# Patient Record
Sex: Female | Born: 1940 | Race: White | Hispanic: No | Smoking: Former smoker
Health system: Southern US, Community
[De-identification: ages and names within clinical notes are randomized; demographics above are authoritative.]

## PROBLEM LIST (undated history)

## (undated) DIAGNOSIS — M810 Age-related osteoporosis without current pathological fracture: Secondary | ICD-10-CM

## (undated) DIAGNOSIS — G4733 Obstructive sleep apnea (adult) (pediatric): Secondary | ICD-10-CM

## (undated) DIAGNOSIS — I6523 Occlusion and stenosis of bilateral carotid arteries: Secondary | ICD-10-CM

## (undated) DIAGNOSIS — Z7901 Long term (current) use of anticoagulants: Secondary | ICD-10-CM

## (undated) DIAGNOSIS — F419 Anxiety disorder, unspecified: Secondary | ICD-10-CM

## (undated) DIAGNOSIS — I10 Essential (primary) hypertension: Secondary | ICD-10-CM

## (undated) DIAGNOSIS — E785 Hyperlipidemia, unspecified: Secondary | ICD-10-CM

## (undated) DIAGNOSIS — I6789 Other cerebrovascular disease: Secondary | ICD-10-CM

## (undated) DIAGNOSIS — M1990 Unspecified osteoarthritis, unspecified site: Secondary | ICD-10-CM

## (undated) DIAGNOSIS — I480 Paroxysmal atrial fibrillation: Secondary | ICD-10-CM

## (undated) DIAGNOSIS — Z8669 Personal history of other diseases of the nervous system and sense organs: Secondary | ICD-10-CM

## (undated) DIAGNOSIS — E119 Type 2 diabetes mellitus without complications: Secondary | ICD-10-CM

## (undated) DIAGNOSIS — I639 Cerebral infarction, unspecified: Secondary | ICD-10-CM

## (undated) DIAGNOSIS — I5189 Other ill-defined heart diseases: Secondary | ICD-10-CM

## (undated) DIAGNOSIS — G4730 Sleep apnea, unspecified: Secondary | ICD-10-CM

## (undated) DIAGNOSIS — I493 Ventricular premature depolarization: Secondary | ICD-10-CM

## (undated) DIAGNOSIS — K5792 Diverticulitis of intestine, part unspecified, without perforation or abscess without bleeding: Secondary | ICD-10-CM

## (undated) DIAGNOSIS — K219 Gastro-esophageal reflux disease without esophagitis: Secondary | ICD-10-CM

## (undated) DIAGNOSIS — R011 Cardiac murmur, unspecified: Secondary | ICD-10-CM

## (undated) DIAGNOSIS — I38 Endocarditis, valve unspecified: Secondary | ICD-10-CM

## (undated) DIAGNOSIS — R002 Palpitations: Secondary | ICD-10-CM

## (undated) DIAGNOSIS — G5601 Carpal tunnel syndrome, right upper limb: Secondary | ICD-10-CM

## (undated) DIAGNOSIS — H269 Unspecified cataract: Secondary | ICD-10-CM

## (undated) DIAGNOSIS — T7840XA Allergy, unspecified, initial encounter: Secondary | ICD-10-CM

## (undated) DIAGNOSIS — I491 Atrial premature depolarization: Secondary | ICD-10-CM

## (undated) DIAGNOSIS — T8859XA Other complications of anesthesia, initial encounter: Secondary | ICD-10-CM

## (undated) DIAGNOSIS — D472 Monoclonal gammopathy: Secondary | ICD-10-CM

## (undated) DIAGNOSIS — F32A Depression, unspecified: Secondary | ICD-10-CM

## (undated) DIAGNOSIS — F4323 Adjustment disorder with mixed anxiety and depressed mood: Secondary | ICD-10-CM

## (undated) DIAGNOSIS — R001 Bradycardia, unspecified: Secondary | ICD-10-CM

## (undated) HISTORY — DX: Unspecified cataract: H26.9

## (undated) HISTORY — DX: Essential (primary) hypertension: I10

## (undated) HISTORY — DX: Cerebral infarction, unspecified: I63.9

## (undated) HISTORY — PX: OTHER SURGICAL HISTORY: SHX169

## (undated) HISTORY — PX: ABDOMINAL HYSTERECTOMY: SHX81

## (undated) HISTORY — PX: OOPHORECTOMY: SHX86

## (undated) HISTORY — PX: JOINT REPLACEMENT: SHX530

## (undated) HISTORY — DX: Hyperlipidemia, unspecified: E78.5

## (undated) HISTORY — PX: HIP ARTHROPLASTY: SHX981

## (undated) HISTORY — PX: HERNIA REPAIR: SHX51

## (undated) HISTORY — PX: KNEE ARTHROSCOPY: SUR90

## (undated) HISTORY — DX: Allergy, unspecified, initial encounter: T78.40XA

## (undated) HISTORY — DX: Sleep apnea, unspecified: G47.30

## (undated) HISTORY — PX: CHOLECYSTECTOMY: SHX55

## (undated) HISTORY — PX: APPENDECTOMY: SHX54

## (undated) HISTORY — PX: LAPAROSCOPIC COLON RESECTION: SUR791

## (undated) HISTORY — DX: Unspecified osteoarthritis, unspecified site: M19.90

## (undated) HISTORY — PX: COLONOSCOPY: SHX174

## (undated) HISTORY — DX: Age-related osteoporosis without current pathological fracture: M81.0

## (undated) HISTORY — PX: CATARACT EXTRACTION: SUR2

## (undated) HISTORY — DX: Gastro-esophageal reflux disease without esophagitis: K21.9

---

## 2011-04-22 DIAGNOSIS — Z9842 Cataract extraction status, left eye: Secondary | ICD-10-CM

## 2011-04-22 DIAGNOSIS — Z9841 Cataract extraction status, right eye: Secondary | ICD-10-CM

## 2011-04-22 HISTORY — DX: Cataract extraction status, left eye: Z98.42

## 2011-04-22 HISTORY — DX: Cataract extraction status, left eye: Z98.41

## 2014-07-26 HISTORY — PX: BLEPHAROPLASTY: SUR158

## 2015-12-25 DIAGNOSIS — I10 Essential (primary) hypertension: Secondary | ICD-10-CM | POA: Insufficient documentation

## 2018-08-24 DIAGNOSIS — G4730 Sleep apnea, unspecified: Secondary | ICD-10-CM | POA: Insufficient documentation

## 2019-07-24 ENCOUNTER — Other Ambulatory Visit: Payer: Self-pay | Admitting: Family Medicine

## 2019-07-24 DIAGNOSIS — E2839 Other primary ovarian failure: Secondary | ICD-10-CM

## 2019-11-19 DIAGNOSIS — F4323 Adjustment disorder with mixed anxiety and depressed mood: Secondary | ICD-10-CM | POA: Insufficient documentation

## 2020-03-20 ENCOUNTER — Encounter: Payer: Self-pay | Admitting: Internal Medicine

## 2020-03-20 ENCOUNTER — Other Ambulatory Visit: Payer: Self-pay

## 2020-03-20 ENCOUNTER — Ambulatory Visit (INDEPENDENT_AMBULATORY_CARE_PROVIDER_SITE_OTHER): Payer: Medicare Other | Admitting: Internal Medicine

## 2020-03-20 DIAGNOSIS — Z9989 Dependence on other enabling machines and devices: Secondary | ICD-10-CM | POA: Diagnosis not present

## 2020-03-20 DIAGNOSIS — G4733 Obstructive sleep apnea (adult) (pediatric): Secondary | ICD-10-CM | POA: Diagnosis not present

## 2020-03-20 DIAGNOSIS — I10 Essential (primary) hypertension: Secondary | ICD-10-CM

## 2020-03-20 NOTE — Progress Notes (Addendum)
[facility] [address] [place], [place] [address]  Pulmonary Sleep Medicine   Office [name] Note   Patient Name: Shannon [name] DOB: Feb 09, 1941 MRN [id number]  Date of Service: 03/20/2020  Complaints/HPI: Patient is here to establish care as she recently moved to the [place] area. History of OSA, last PSG in 2013. Over the last few months she has noticed her CPAP has been malfunctioning. She says it has been overheating. She also feels as though she is not getting enough pressure as she has noticed increased daytime sleepiness, increased BP, weight gain and headaches and bilateral ankle swelling. She is concerned about this as she has worn her CPAP every night since she has had it.   ROS  General: (-) fever, (-) chills, (-) night sweats, (-) weakness Skin: (-) rashes, (-) itching,. Eyes: (-) visual changes, (-) redness, (-) itching. Nose and Sinuses: (-) nasal stuffiness or itchiness, (-) postnasal drip, (-) nosebleeds, (-) sinus trouble. Mouth and Throat: (-) sore throat, (-) hoarseness. Neck: (-) swollen glands, (-) enlarged thyroid, (-) neck pain. Respiratory: - cough, (-) bloody sputum, - shortness of breath, - wheezing. Cardiovascular: + ankle swelling, (-) chest pain. Lymphatic: (-) lymph node enlargement. Neurologic: (-) numbness, (-) tingling. Psychiatric: (-) anxiety, (-) depression   Current Medication: Outpatient Encounter Medications as of 03/20/2020  Medication Sig  . Apoaequorin (PREVAGEN PO) Take 1 capsule by mouth every evening.  . calcium citrate-vitamin D (CITRACAL+D) 315-200 MG-UNIT tablet Take by mouth.  . fluticasone (FLONASE) 50 MCG/ACT nasal spray Place into the nose.  . hydrochlorothiazide (HYDRODIURIL) 25 MG tablet Take by mouth.  . hydrocortisone (ANUSOL-HC) 25 MG suppository Place rectally.  Marland Kitchen ketotifen (ZADITOR) 0.025 % ophthalmic solution Apply to eye.  . losartan (COZAAR) 100 MG tablet Take by mouth.  . meloxicam (MOBIC) 15 MG  tablet Take by mouth.  . montelukast (SINGULAIR) 10 MG tablet Take by mouth.  . Multiple Vitamin (MULTI-VITAMIN) tablet Take 1 tablet by mouth daily.  . pantoprazole (PROTONIX) 40 MG tablet Take by mouth.   No facility-administered encounter medications on file as of 03/20/2020.    Surgical History: Past Surgical History:  Procedure Laterality Date  . ABDOMINAL HYSTERECTOMY    . APPENDECTOMY    . CHOLECYSTECTOMY    . JOINT REPLACEMENT     left hip replacement, right knee replacement  . vein ligations      Medical History: Past Medical History:  Diagnosis Date  . Allergy   . Arthritis   . Cataract    surgery both eyes 04/22/2011 implants  . GERD (gastroesophageal reflux disease)   . Hyperlipidemia   . Hypertension   . Osteoporosis   . Sleep apnea     Family History: Family History  Problem Relation Age of Onset  . Cancer Father   . Cancer Sister        liver, lungs  . Arthritis Sister        rheumatoid  . Diabetes Brother   . Cancer Brother        pancreatic, kidney    Social History: Social History   Socioeconomic History  . Marital status: Single    Spouse name: Not on file  . Number of children: Not on file  . Years of education: Not on file  . Highest education level: Not on file  Occupational History  . Not on file  Tobacco Use  . Smoking status: Former Smoker    Types: Cigarettes    Quit date: 08/09/1968  Years since quitting: 51.6  . Smokeless tobacco: Never Used  Substance and Sexual Activity  . Alcohol use: Never  . Drug use: Never  . Sexual activity: Not on file  Other Topics Concern  . Not on file  Social History Narrative  . Not on file   Social Determinants of Health   Financial Resource Strain:   . Difficulty of Paying Living Expenses: Not on file  Food Insecurity:   . Worried About Programme researcher, broadcasting/film/video in the Last Year: Not on file  . Ran Out of Food in the Last Year: Not on file  Transportation Needs:   . Lack of  Transportation (Medical): Not on file  . Lack of Transportation (Non-Medical): Not on file  Physical Activity:   . Days of Exercise per Week: Not on file  . Minutes of Exercise per Session: Not on file  Stress:   . Feeling of Stress : Not on file  Social Connections:   . Frequency of Communication with Friends and Family: Not on file  . Frequency of Social Gatherings with Friends and Family: Not on file  . Attends Religious Services: Not on file  . Active Member of Clubs or Organizations: Not on file  . Attends Banker Meetings: Not on file  . Marital Status: Not on file  Intimate Partner Violence:   . Fear of Current or Ex-Partner: Not on file  . Emotionally Abused: Not on file  . Physically Abused: Not on file  . Sexually Abused: Not on file    Vital Signs: Blood pressure (!) 142/68, pulse 65, temperature (!) 97.3 F (36.3 C), resp. rate 16, height 5\' 4"  (1.626 m), weight 156 lb 9.6 oz (71 kg), SpO2 96 %.  Examination: General Appearance: The patient is well-developed, well-nourished, and in no distress. Skin: Gross inspection of skin unremarkable. Head: normocephalic, no gross deformities. Eyes: no gross deformities noted. ENT: ears appear grossly normal no exudates. Neck: Supple. No thyromegaly. No LAD. Respiratory: Clear throughout. Cardiovascular: Normal S1 and S2 without murmur or rub. Extremities: No cyanosis. pulses are equal. Neurologic: Alert and oriented. No involuntary movements.  LABS: No results found for this or any previous [name] (from the past 2160 hour(s)).  Radiology: Patient was never admitted.   Assessment and Plan: Patient Active Problem List   Diagnosis Date Noted  . Sleep apnea 08/24/2018  . Essential hypertension 12/25/2015   1. OSA on CPAP In need of new CPAP machine as her current machine is malfunctioning. Split study done in 2013, AHI 14.2. Will need to have a CPAP titration study to assess for appropriate pressures in  controlling her OSA. Since malfunctioning, she has reported side effects of untreated OSA. Will work diligently to have testing completed in order to get her a new machine. - Cpap titration; Future  2. Essential hypertension BP elevated today, she has noticed an elevation in her BP since the CPAP started malfunctioning. Advised to inform PCP of elevated BP readings and follow-up as appropriate.   General Counseling: I have discussed the findings of the evaluation and examination with 2014.  I have also discussed any further diagnostic evaluation thatmay be needed or ordered today. Shannon [name]. We also reviewed her medications today and discussed drug interactions and side effects including but not limited excessive drowsiness and altered mental states. We also discussed that there is always a risk not just to her but also people around her. she has been  encouraged to call the office with any questions or concerns that should arise related to todays [name].  Orders Placed This Encounter  Procedures  . Cpap titration    Standing Status:   Future    Standing Expiration Date:   03/20/2021    Scheduling Instructions:     Urgent--Needs new machine    Order Specific Question:   Where should this test be performed:    Answer:   Other     Time spent: 30  I have personally obtained a history, examined the patient, evaluated laboratory and imaging results, formulated the assessment and plan and placed orders. This patient was seen by Brent General AGNP-C in Collaboration with Dr. Freda Munro as a part of collaborative care agreement.    Yevonne Pax, MD [facility] Pulmonary and Critical Care Sleep medicine

## 2020-03-20 NOTE — Patient Instructions (Signed)

## 2020-04-03 ENCOUNTER — Encounter (INDEPENDENT_AMBULATORY_CARE_PROVIDER_SITE_OTHER): Payer: Medicare Other | Admitting: Internal Medicine

## 2020-04-03 DIAGNOSIS — G4733 Obstructive sleep apnea (adult) (pediatric): Secondary | ICD-10-CM | POA: Diagnosis not present

## 2020-04-03 DIAGNOSIS — Z9989 Dependence on other enabling machines and devices: Secondary | ICD-10-CM

## 2020-04-07 NOTE — Procedures (Signed)
[facility]  Polysomnogram Report Part I  Phone: [phone] Fax: [phone]  Patient Name: Shannon [name], Shannon [name] Acquisition Number: [id number]  Date of Birth: 1940-11-20 Acquisition Date: 04/03/2020  Referring Physician: Leeanne Deed, AGNP-C     History: The patient is a 79 year old female with OSA for re-titration of CPAP. Medical History: ??? OSA, hypertension, acid reflux, arthritis ??  Medications: meloxicam, Prevagen, vitamin D, Flonase, hydrochlorothiazide, ketotifen, losartan, montelukast, pantopraxole.  Procedure: This routine overnight polysomnogram was performed on the [facility] 5 using the standard CPAP protocol. This included 6 channels of EEG, 2 channels of EOG, chin EMG, bilateral anterior tibialis EMG, nasal/oral thermistor, PTAF (nasal pressure transducer), chest and abdominal wall movements, EKG, and pulse oximetry.  Description: The total recording time was 407.0 minutes. The total sleep time was 333.5 minutes. There were a total of 46.0 minutes of wakefulness after sleep onset for a?reduced?sleep efficiency of 81.9%. The latency to sleep onset was within normal limits?at 27.5 minutes. The R sleep onset latency was within normal limits at 112.0 minutes.?? Sleep parameters, as a percentage of the total sleep time, demonstrated 15.7% of sleep was in N1 sleep, 54.4% N2, 16.5% N3 and 13.3% R sleep. There were a total of 10 arousals for an arousal index of 1.8 arousals per hour of sleep that was normal.???  The patient did well at all CPAP pressures. There were no respiratory events recorded during the pressure titration. CPAP was initiated at 4 cm H2O at lights out, 10:49 p.m. It was titrated in 1-2 cm increments, largely for flow limitation in REM to the final pressure of 12 cm H2O. The patient was unable to sleep on her back.  Additionally, the baseline oxygen saturation during wakefulness was 95%, during NREM sleep averaged 95%, and during REM sleep averaged 95%. The total  duration of oxygen < 90% was 0.1 minutes.  Cardiac monitoring-  There were no significant cardiac rhythm irregularities.   Periodic limb movement monitoring- demonstrated that there were 9 periodic limb movements for a periodic limb movement index of 1.6 periodic limb movements per hour of sleep.   Impression: This patient's obstructive sleep apnea demonstrated significant improvement with the utilization of nasal CPAP at 12 cm H2O.      Recommendations: 1. CPAP titration study is adequate to control this patients Obstructive Sleep Apnea. The optimal pressure appears to be 12 cm H2O 2. Nasal Decongestants and antihistamines may be of help for increased upper airway resistance. 3. Weight loss through dietary and lifestyle modifications to include exercise is recommended in the presence of obesity. 4. A search for and treatment of underlying cardiopulmonary disease is suggested in the setting of desaturations without significant sleep disordered breathing. 5. Alternative treatment options if the patient is not willing or is unable to use CPAP include oral appliances as well as surgical intervention in the right clinical setting. 6. Clinical correlation is recommended. Please feel free to call the office for any further questions or assistance in the care of this patient. 7. An Air Fit N20  mask, size large, was used. Chin strap -no. Humidifier used during study- yes.     Yevonne Pax, MD, [facility] ABMS Pulmonary, Critical Care and Sleep Medicine  Electronically reviewed and digitally signed

## 2020-04-10 ENCOUNTER — Other Ambulatory Visit: Payer: Self-pay | Admitting: Family Medicine

## 2020-04-10 DIAGNOSIS — Z1231 Encounter for screening mammogram for malignant neoplasm of breast: Secondary | ICD-10-CM

## 2020-04-15 ENCOUNTER — Encounter: Payer: Self-pay | Admitting: Internal Medicine

## 2020-04-15 ENCOUNTER — Telehealth: Payer: Self-pay

## 2020-04-15 DIAGNOSIS — Z91199 Patient's noncompliance with other medical treatment and regimen due to unspecified reason: Secondary | ICD-10-CM

## 2020-04-15 NOTE — Telephone Encounter (Signed)
Confirmed patient appt for 04/17/20 

## 2020-04-15 NOTE — Progress Notes (Signed)
Scanned in ss from fg 

## 2020-04-17 ENCOUNTER — Encounter: Payer: Self-pay | Admitting: Internal Medicine

## 2020-04-17 ENCOUNTER — Other Ambulatory Visit: Payer: Self-pay

## 2020-04-17 ENCOUNTER — Ambulatory Visit (INDEPENDENT_AMBULATORY_CARE_PROVIDER_SITE_OTHER): Payer: Medicare Other | Admitting: Internal Medicine

## 2020-04-17 DIAGNOSIS — G4733 Obstructive sleep apnea (adult) (pediatric): Secondary | ICD-10-CM

## 2020-04-17 DIAGNOSIS — Z9989 Dependence on other enabling machines and devices: Secondary | ICD-10-CM | POA: Diagnosis not present

## 2020-04-17 DIAGNOSIS — I10 Essential (primary) hypertension: Secondary | ICD-10-CM | POA: Diagnosis not present

## 2020-04-17 NOTE — Progress Notes (Signed)
[facility] [address] [place], [place] [address]  Pulmonary Sleep Medicine   Office Visit Note  Patient Name: AVERY EUSTICE DOB: May 27, 1941 MRN [id number]  Date of Service: 04/17/2020  Complaints/HPI: Patient is here for routine pulmonary follow-up She has had CPAP titration study, she is very upset that she has not received a new CPAP yet Will call [facility] to see what the hold up is She has been using a broken CPAP machine for several months She is concerned because since her CPAP has been broken she has noticed an elevation in her BP as well as increased daytime sleepiness  ROS  General: (-) fever, (-) chills, (-) night sweats, (-) weakness Skin: (-) rashes, (-) itching,. Eyes: (-) visual changes, (-) redness, (-) itching. Nose and Sinuses: (-) nasal stuffiness or itchiness, (-) postnasal drip, (-) nosebleeds, (-) sinus trouble. Mouth and Throat: (-) sore throat, (-) hoarseness. Neck: (-) swollen glands, (-) enlarged thyroid, (-) neck pain. Respiratory: - cough, (-) bloody sputum, - shortness of breath, - wheezing. Cardiovascular: - ankle swelling, (-) chest pain. Lymphatic: (-) lymph node enlargement. Neurologic: (-) numbness, (-) tingling. Psychiatric: (-) anxiety, (-) depression   Current Medication: Outpatient Encounter Medications as of 04/17/2020  Medication Sig  . Apoaequorin (PREVAGEN PO) Take 1 capsule by mouth every evening.  . calcium citrate-vitamin D (CITRACAL+D) 315-200 MG-UNIT tablet Take by mouth.  . fluticasone (FLONASE) 50 MCG/ACT nasal spray Place into the nose.  . hydrochlorothiazide (HYDRODIURIL) 25 MG tablet Take by mouth.  . hydrocortisone (ANUSOL-HC) 25 MG suppository Place rectally.  Marland Kitchen ketotifen (ZADITOR) 0.025 % ophthalmic solution Apply to eye.  . losartan (COZAAR) 100 MG tablet Take by mouth.  . meloxicam (MOBIC) 15 MG tablet Take by mouth.  . montelukast (SINGULAIR) 10 MG tablet Take by mouth.  . Multiple Vitamin  (MULTI-VITAMIN) tablet Take 1 tablet by mouth daily.  . pantoprazole (PROTONIX) 40 MG tablet Take by mouth.   No facility-administered encounter medications on file as of 04/17/2020.    Surgical History: Past Surgical History:  Procedure Laterality Date  . ABDOMINAL HYSTERECTOMY    . APPENDECTOMY    . CHOLECYSTECTOMY    . JOINT REPLACEMENT     left hip replacement, right knee replacement  . vein ligations      Medical History: Past Medical History:  Diagnosis Date  . Allergy   . Arthritis   . Cataract    surgery both eyes 04/22/2011 implants  . GERD (gastroesophageal reflux disease)   . Hyperlipidemia   . Hypertension   . Osteoporosis   . Sleep apnea     Family History: Family History  Problem Relation Age of Onset  . Cancer Father   . Cancer Sister        liver, lungs  . Arthritis Sister        rheumatoid  . Diabetes Brother   . Cancer Brother        pancreatic, kidney    Social History: Social History   Socioeconomic History  . Marital status: Single    Spouse name: Not on file  . Number of children: Not on file  . Years of education: Not on file  . Highest education level: Not on file  Occupational History  . Not on file  Tobacco Use  . Smoking status: Former Smoker    Types: Cigarettes    Quit date: 08/09/1968    Years since quitting: 51.7  . Smokeless tobacco: Never Used  Substance and  Sexual Activity  . Alcohol use: Never  . Drug use: Never  . Sexual activity: Not on file  Other Topics Concern  . Not on file  Social History Narrative  . Not on file   Social Determinants of Health   Financial Resource Strain:   . Difficulty of Paying Living Expenses: Not on file  Food Insecurity:   . Worried About Programme researcher, broadcasting/film/video in the Last Year: Not on file  . Ran Out of Food in the Last Year: Not on file  Transportation Needs:   . Lack of Transportation (Medical): Not on file  . Lack of Transportation (Non-Medical): Not on file  Physical  Activity:   . Days of Exercise per Week: Not on file  . Minutes of Exercise per Session: Not on file  Stress:   . Feeling of Stress : Not on file  Social Connections:   . Frequency of Communication with Friends and Family: Not on file  . Frequency of Social Gatherings with Friends and Family: Not on file  . Attends Religious Services: Not on file  . Active Member of Clubs or Organizations: Not on file  . Attends Banker Meetings: Not on file  . Marital Status: Not on file  Intimate Partner Violence:   . Fear of Current or Ex-Partner: Not on file  . Emotionally Abused: Not on file  . Physically Abused: Not on file  . Sexually Abused: Not on file    Vital Signs: Blood pressure (!) 148/82, pulse 70, temperature (!) 97.1 F (36.2 C), resp. rate 16, height 5\' 4"  (1.626 m), weight 156 lb 3.2 oz (70.9 kg), SpO2 97 %.  Examination: General Appearance: The patient is well-developed, well-nourished, and in no distress. Skin: Gross inspection of skin unremarkable. Head: normocephalic, no gross deformities. Eyes: no gross deformities noted. ENT: ears appear grossly normal no exudates. Neck: Supple. No thyromegaly. No LAD. Respiratory: Clear throughout,no rhonchi or wheezing noted. Cardiovascular: Normal S1 and S2 without murmur or rub. Extremities: No cyanosis. pulses are equal. Neurologic: Alert and oriented. No involuntary movements.  LABS: No results found for this or any previous visit (from the past 2160 hour(s)).  Radiology: Patient was never admitted.  No results found.  No results found.    Assessment and Plan: Patient Active Problem List   Diagnosis Date Noted  . Sleep apnea 08/24/2018  . Essential hypertension 12/25/2015    1. OSA on CPAP Have her set up with [facility] to get her new CPAP machine on Tuesday of next week. She is to call office if she does not receive new machine at that time.  2. Essential hypertension Will continue with  monitoring her BP once back on appropriate CPAP, may need to follow-up with PCP.  General Counseling: I have discussed the findings of the evaluation and examination with Wednesday.  I have also discussed any further diagnostic evaluation thatmay be needed or ordered today. Eilis verbalizes understanding of the findings of todays visit. We also reviewed her medications today and discussed drug interactions and side effects including but not limited excessive drowsiness and altered mental states. We also discussed that there is always a risk not just to her but also people around her. she has been encouraged to call the office with any questions or concerns that should arise related to todays visit.     Time spent: 30  I have personally obtained a history, examined the patient, evaluated laboratory and imaging results, formulated the assessment and  plan and placed orders. This patient was seen by Brent General AGNP-C in Collaboration with Dr. Freda Munro as a part of collaborative care agreement.    Yevonne Pax, MD [facility] Pulmonary and Critical Care Sleep medicine

## 2020-04-21 ENCOUNTER — Encounter: Payer: Self-pay | Admitting: Internal Medicine

## 2020-04-21 NOTE — Patient Instructions (Signed)

## 2020-04-28 ENCOUNTER — Other Ambulatory Visit: Payer: Self-pay

## 2020-04-28 ENCOUNTER — Ambulatory Visit
Admission: RE | Admit: 2020-04-28 | Discharge: 2020-04-28 | Disposition: A | Discharge status: Home / Self Care | Payer: Medicare Other | Source: Ambulatory Visit | Attending: Family Medicine | Admitting: Family Medicine

## 2020-04-28 DIAGNOSIS — Z1231 Encounter for screening mammogram for malignant neoplasm of breast: Secondary | ICD-10-CM | POA: Diagnosis not present

## 2020-06-02 ENCOUNTER — Ambulatory Visit (INDEPENDENT_AMBULATORY_CARE_PROVIDER_SITE_OTHER): Payer: Medicare Other | Admitting: Internal Medicine

## 2020-06-02 DIAGNOSIS — Z9989 Dependence on other enabling machines and devices: Secondary | ICD-10-CM

## 2020-06-02 DIAGNOSIS — J302 Other seasonal allergic rhinitis: Secondary | ICD-10-CM | POA: Insufficient documentation

## 2020-06-02 DIAGNOSIS — G4733 Obstructive sleep apnea (adult) (pediatric): Secondary | ICD-10-CM

## 2020-06-02 DIAGNOSIS — Z7189 Other specified counseling: Secondary | ICD-10-CM | POA: Diagnosis not present

## 2020-06-02 NOTE — Progress Notes (Signed)
[facility] [address] [place], [place] [address]  Pulmonary Sleep Medicine   Office Visit Note  Patient Name: Shannon [name] DOB: [birth year] MRN [id number]    Chief Complaint: Obstructive Sleep Apnea visit  Brief History:  Shannon [name] is seen today for initial consultation. She recently received a replacement CPAP. The patient has a 8 year history of sleep apnea. Patient is using PAP nightly.  The patient feels more rested after sleeping with PAP.  The patient reports benefiting from PAP use. She has been waking up due to dry mouth. Reported sleepiness is  improve and the Epworth Sleepiness Score is 3 out of 24. The patient rarely take naps. The patient complains of the following: dry mouth.  The compliance download shows excellent compliance with an average use time of 7.3 hours. The AHI is 0.8  The patient does not complain of limb movements disrupting sleep.  ROS  General: (-) fever, (-) chills, (-) night sweat Nose and Sinuses: (-) nasal stuffiness or itchiness, (-) postnasal drip, (-) nosebleeds, (-) sinus trouble. Mouth and Throat: (-) sore throat, (-) hoarseness. Neck: (-) swollen glands, (-) enlarged thyroid, (-) neck pain. Respiratory: - cough, - shortness of breath, - wheezing. Neurologic: - numbness, - tingling. Psychiatric: - anxiety, - depression   Current Medication: Outpatient Encounter Medications as of 06/02/2020  Medication Sig  . Apoaequorin (PREVAGEN PO) Take 1 capsule by mouth every evening.  . calcium citrate-vitamin D (CITRACAL+D) 315-200 MG-UNIT tablet Take by mouth.  . fluticasone (FLONASE) 50 MCG/ACT nasal spray Place into the nose.  . hydrochlorothiazide (HYDRODIURIL) 25 MG tablet Take by mouth.  . hydrocortisone (ANUSOL-HC) 25 MG suppository Place rectally.  Marland Kitchen ketotifen (ZADITOR) 0.025 % ophthalmic solution Apply to eye.  . losartan (COZAAR) 100 MG tablet Take by mouth.  . meloxicam (MOBIC) 15 MG tablet Take by mouth.  . montelukast  (SINGULAIR) 10 MG tablet Take by mouth.  . Multiple Vitamin (MULTI-VITAMIN) tablet Take 1 tablet by mouth daily.  . pantoprazole (PROTONIX) 40 MG tablet Take by mouth.   No facility-administered encounter medications on file as of 06/02/2020.    Surgical History: Past Surgical History:  Procedure Laterality Date  . ABDOMINAL HYSTERECTOMY    . APPENDECTOMY    . CHOLECYSTECTOMY    . JOINT REPLACEMENT     left hip replacement, right knee replacement  . vein ligations      Medical History: Past Medical History:  Diagnosis Date  . Allergy   . Arthritis   . Cataract    surgery both eyes 04/22/2011 implants  . GERD (gastroesophageal reflux disease)   . Hyperlipidemia   . Hypertension   . Osteoporosis   . Sleep apnea     Family History: Non contributory to the present illness  Social History: Social History   Socioeconomic History  . Marital status: Single    Spouse name: Not on file  . Number of children: Not on file  . Years of education: Not on file  . Highest education level: Not on file  Occupational History  . Not on file  Tobacco Use  . Smoking status: Former Smoker    Types: Cigarettes    Quit date: 08/09/1968    Years since quitting: 51.8  . Smokeless tobacco: Never Used  Substance and Sexual Activity  . Alcohol use: Never  . Drug use: Never  . Sexual activity: Not on file  Other Topics Concern  . Not on file  Social History Narrative  . Not  on file   Social Determinants of Health   Financial Resource Strain:   . Difficulty of Paying Living Expenses: Not on file  Food Insecurity:   . Worried About Programme researcher, broadcasting/film/video in the Last Year: Not on file  . Ran Out of Food in the Last Year: Not on file  Transportation Needs:   . Lack of Transportation (Medical): Not on file  . Lack of Transportation (Non-Medical): Not on file  Physical Activity:   . Days of Exercise per Week: Not on file  . Minutes of Exercise per Session: Not on file  Stress:   .  Feeling of Stress : Not on file  Social Connections:   . Frequency of Communication with Friends and Family: Not on file  . Frequency of Social Gatherings with Friends and Family: Not on file  . Attends Religious Services: Not on file  . Active Member of Clubs or Organizations: Not on file  . Attends Banker Meetings: Not on file  . Marital Status: Not on file  Intimate Partner Violence:   . Fear of Current or Ex-Partner: Not on file  . Emotionally Abused: Not on file  . Physically Abused: Not on file  . Sexually Abused: Not on file    Vital Signs: Blood pressure 134/82, pulse (!) 56, height 5\' 5"  (1.651 m), weight 156 lb (70.8 kg), SpO2 97 %.  Examination: General Appearance: The patient is well-developed, well-nourished, and in no distress. Neck Circumference: 35 Skin: Gross inspection of skin unremarkable. Head: normocephalic, no gross deformities. Eyes: no gross deformities noted. ENT: ears appear grossly normal Neurologic: Alert and oriented. No involuntary movements.    EPWORTH SLEEPINESS SCALE:  Scale:  (0)= no chance of dozing; (1)= slight chance of dozing; (2)= moderate chance of dozing; (3)= high chance of dozing  Chance  Situtation    Sitting and reading: 1    Watching TV: 0    Sitting Inactive in public: 0    As a passenger in car: 0      Lying down to rest: 2    Sitting and talking: 0    Sitting quielty after lunch: 0    In a car, stopped in traffic: 0   TOTAL SCORE:   3 out of 24    SLEEP STUDIES:  1. Split 05/23/12 AHI 14 SpO8min 81%, CPAP 6 cm H2O   CPAP COMPLIANCE DATA:  Date Range: 04/30/20-05/29/20  Average Daily Use: 7.3 hours  Median Use: 7.4  Compliance for > 4 Hours: 100%  AHI: 0.8 respiratory events per hour  Days Used: 30/30  Mask Leak: 38.7  95th Percentile Pressure: 12   LABS: No results found for this or any previous visit (from the past 2160 hour(s)).  Radiology: MM 3D SCREEN BREAST  BILATERAL  Result Date: 05/06/2020 CLINICAL DATA:  Screening. EXAM: DIGITAL SCREENING BILATERAL MAMMOGRAM WITH TOMO AND CAD COMPARISON:  Previous exam(s). ACR Breast Density Category b: There are scattered areas of fibroglandular density. FINDINGS: There are no findings suspicious for malignancy. Images were processed with CAD. IMPRESSION: No mammographic evidence of malignancy. A result letter of this screening mammogram will be mailed directly to the patient. RECOMMENDATION: Screening mammogram in one year. (Code:SM-B-01Y) BI-RADS CATEGORY  1: Negative. Electronically Signed   By: 07/06/2020 M.D.   On: 05/06/2020 15:52    No results found.  No results found.    Assessment and Plan: Patient Active Problem List   Diagnosis Date Noted  . OSA  on CPAP 06/02/2020  . CPAP use counseling 06/02/2020  . Seasonal allergies 06/02/2020  . Essential hypertension 12/25/2015      The patient does tolerate PAP and reports significant benefit from PAP use. The patient was reminded how to adjust the humidifier and fit mask. The patient was also counselled to continue her exercise and diet program.. The compliance is excellent. The apnea is very well controlled.   1. OSA- continue excellent compliance. 2. CPAP couseling-Discussed importance of adequate CPAP use as well as proper care and cleaning techniques of machine and all supplies. 3. Seasonal allergies-Symptoms well controlled at this time, continue with current therapy 4. HTN-BP well controlled today, much improvement since starting back on her CPAP nightly  General Counseling: I have discussed the findings of the evaluation and examination with Shannon [name].  I have also discussed any further diagnostic evaluation thatmay be needed or ordered today. Shannon [name] verbalizes understanding of the findings of todays visit. We also reviewed her medications today and discussed drug interactions and side effects including but not limited excessive drowsiness and  altered mental states. We also discussed that there is always a risk not just to her but also people around her. she has been encouraged to call the office with any questions or concerns that should arise related to todays visit.   I have personally obtained a history, examined the patient, evaluated laboratory and imaging results, formulated the assessment and plan and placed orders.  This patient was seen by Theotis Burrow, AGNP-C in collaboration with Dr. Freda Munro as a part of collaborative care agreement.  Valentino Hue Sol Blazing, PhD, FAASM  Diplomate, American Board of Sleep Medicine    Yevonne Pax, MD [facility] Diplomate ABMS Pulmonary and Critical Care Medicine Sleep medicine

## 2020-06-02 NOTE — Patient Instructions (Signed)

## 2020-09-15 ENCOUNTER — Other Ambulatory Visit
Admission: RE | Admit: 2020-09-15 | Discharge: 2020-09-15 | Disposition: A | Discharge status: Home / Self Care | Payer: Medicare Other | Source: Ambulatory Visit | Attending: Internal Medicine | Admitting: Internal Medicine

## 2020-09-15 ENCOUNTER — Other Ambulatory Visit: Payer: Self-pay

## 2020-09-15 DIAGNOSIS — Z01812 Encounter for preprocedural laboratory examination: Secondary | ICD-10-CM | POA: Diagnosis present

## 2020-09-15 DIAGNOSIS — Z20822 Contact with and (suspected) exposure to covid-19: Secondary | ICD-10-CM | POA: Insufficient documentation

## 2020-09-15 LAB — SARS CORONAVIRUS 2 (TAT 6-24 HRS): SARS Coronavirus 2: NEGATIVE

## 2020-09-16 ENCOUNTER — Encounter: Payer: Self-pay | Admitting: Internal Medicine

## 2020-09-17 ENCOUNTER — Other Ambulatory Visit: Payer: Self-pay

## 2020-09-17 ENCOUNTER — Ambulatory Visit: Payer: Medicare Other | Admitting: Certified Registered Nurse Anesthetist

## 2020-09-17 ENCOUNTER — Encounter
Admission: RE | Disposition: A | Discharge status: Home / Self Care | Payer: Self-pay | Source: Home / Self Care | Attending: Internal Medicine

## 2020-09-17 ENCOUNTER — Ambulatory Visit
Admission: RE | Admit: 2020-09-17 | Discharge: 2020-09-17 | Disposition: A | Discharge status: Home / Self Care | Payer: Medicare Other | Attending: Internal Medicine | Admitting: Internal Medicine

## 2020-09-17 DIAGNOSIS — K640 First degree hemorrhoids: Secondary | ICD-10-CM | POA: Insufficient documentation

## 2020-09-17 DIAGNOSIS — Z980 Intestinal bypass and anastomosis status: Secondary | ICD-10-CM | POA: Insufficient documentation

## 2020-09-17 DIAGNOSIS — K5730 Diverticulosis of large intestine without perforation or abscess without bleeding: Secondary | ICD-10-CM | POA: Diagnosis not present

## 2020-09-17 DIAGNOSIS — Z791 Long term (current) use of non-steroidal anti-inflammatories (NSAID): Secondary | ICD-10-CM | POA: Insufficient documentation

## 2020-09-17 DIAGNOSIS — Z86010 Personal history of colonic polyps: Secondary | ICD-10-CM | POA: Diagnosis not present

## 2020-09-17 DIAGNOSIS — Z79899 Other long term (current) drug therapy: Secondary | ICD-10-CM | POA: Insufficient documentation

## 2020-09-17 DIAGNOSIS — Z881 Allergy status to other antibiotic agents status: Secondary | ICD-10-CM | POA: Insufficient documentation

## 2020-09-17 DIAGNOSIS — Z91041 Radiographic dye allergy status: Secondary | ICD-10-CM | POA: Diagnosis not present

## 2020-09-17 DIAGNOSIS — Z1211 Encounter for screening for malignant neoplasm of colon: Secondary | ICD-10-CM | POA: Insufficient documentation

## 2020-09-17 DIAGNOSIS — Z880 Allergy status to penicillin: Secondary | ICD-10-CM | POA: Diagnosis not present

## 2020-09-17 DIAGNOSIS — Z888 Allergy status to other drugs, medicaments and biological substances status: Secondary | ICD-10-CM | POA: Insufficient documentation

## 2020-09-17 HISTORY — DX: Personal history of other diseases of the nervous system and sense organs: Z86.69

## 2020-09-17 HISTORY — PX: COLONOSCOPY WITH PROPOFOL: SHX5780

## 2020-09-17 HISTORY — DX: Diverticulitis of intestine, part unspecified, without perforation or abscess without bleeding: K57.92

## 2020-09-17 HISTORY — DX: Anxiety disorder, unspecified: F41.9

## 2020-09-17 HISTORY — DX: Depression, unspecified: F32.A

## 2020-09-17 HISTORY — DX: Adjustment disorder with mixed anxiety and depressed mood: F43.23

## 2020-09-17 HISTORY — DX: Cardiac murmur, unspecified: R01.1

## 2020-09-17 SURGERY — COLONOSCOPY WITH PROPOFOL
Anesthesia: General

## 2020-09-17 MED ORDER — PROPOFOL 10 MG/ML IV BOLUS
INTRAVENOUS | Status: DC | PRN
  Administered 2020-09-17: 50 mg via INTRAVENOUS

## 2020-09-17 MED ORDER — PROPOFOL 500 MG/50ML IV EMUL
INTRAVENOUS | Status: DC | PRN
  Administered 2020-09-17: 160 ug/kg/min via INTRAVENOUS

## 2020-09-17 MED ORDER — SODIUM CHLORIDE 0.9 % IV SOLN
INTRAVENOUS | Status: DC
  Administered 2020-09-17: 20 mL/h via INTRAVENOUS

## 2020-09-17 MED ORDER — GLYCOPYRROLATE 0.2 MG/ML IJ SOLN
INTRAMUSCULAR | Status: DC | PRN
  Administered 2020-09-17: .1 mg via INTRAVENOUS

## 2020-09-17 NOTE — Anesthesia Postprocedure Evaluation (Signed)
Anesthesia Post Note  Patient: ADI SEALES  Procedure(s) Performed: COLONOSCOPY WITH PROPOFOL (N/A )  Patient location during evaluation: Phase II Anesthesia Type: General Level of consciousness: awake and alert, awake and oriented Pain management: pain level controlled Vital Signs Assessment: post-procedure vital signs reviewed and stable Respiratory status: spontaneous breathing, nonlabored ventilation and respiratory function stable Cardiovascular status: blood pressure returned to baseline and stable Postop Assessment: no apparent nausea or vomiting Anesthetic complications: no   No complications documented.   Last Vitals:  Vitals:   09/17/20 1058 09/17/20 1145  BP: (!) 112/47 (!) 99/56  Pulse: 84   Resp: 20   Temp: (!) 36.1 C 36.8 C  SpO2: 96%     Last Pain:  Vitals:   09/17/20 1155  TempSrc:   PainSc: 0-No pain                 Manfred Arch

## 2020-09-17 NOTE — Transfer of Care (Signed)
Immediate Anesthesia Transfer of Care Note  Patient: Shannon [name]  Procedure(s) Performed: COLONOSCOPY WITH PROPOFOL (N/A )  Patient Location: PACU  Anesthesia Type:General  Level of Consciousness: awake and alert   Airway & Oxygen Therapy: Patient Spontanous Breathing and Patient connected to nasal cannula oxygen  Post-op Assessment: Report given to RN and Post -op Vital signs reviewed and stable  Post vital signs: Reviewed and stable  Last Vitals:  Vitals Value Taken Time  BP 99/56   Temp    Pulse 71 09/17/20 1147  Resp 21 09/17/20 1147  SpO2 97 % 09/17/20 1147  Vitals shown include unvalidated device data.  Last Pain:  Vitals:   09/17/20 1058  TempSrc: Temporal  PainSc: 0-No pain         Complications: No complications documented.

## 2020-09-17 NOTE — H&P (Signed)
Outpatient short stay form Pre-procedure 09/17/2020 10:56 AM Shannon [name], M.D.  Primary Physician: [name], M.D.  Reason for visit:  Personal history of colon polyps  History of present illness:                            Patient presents for colonoscopy for a personal hx of colon polyps. The patient denies abdominal pain, abnormal weight loss or rectal bleeding.      Current Facility-Administered Medications:  .  0.9 %  sodium chloride infusion, , Intravenous, Continuous, [place], Boykin Nearing, MD  Medications Prior to Admission  Medication Sig Dispense Refill Last Dose  . Apoaequorin (PREVAGEN PO) Take 1 capsule by mouth every evening.   09/16/2020 at Unknown time  . calcium citrate-vitamin D (CITRACAL+D) 315-200 MG-UNIT tablet Take by mouth.   09/16/2020 at Unknown time  . fluticasone (FLONASE) 50 MCG/ACT nasal spray Place into the nose.   09/16/2020 at Unknown time  . gabapentin (NEURONTIN) 100 MG capsule Take 100 mg by mouth 3 (three) times daily.   09/16/2020 at Unknown time  . hydrochlorothiazide (HYDRODIURIL) 25 MG tablet Take by mouth.   09/17/2020 at 0700  . hydrocortisone (ANUSOL-HC) 25 MG suppository Place rectally.   09/16/2020 at Unknown time  . ketotifen (ZADITOR) 0.025 % ophthalmic solution Apply to eye.   09/16/2020 at Unknown time  . losartan (COZAAR) 100 MG tablet Take by mouth.   09/17/2020 at 0700  . meloxicam (MOBIC) 15 MG tablet Take by mouth.   Past Week at Unknown time  . montelukast (SINGULAIR) 10 MG tablet Take by mouth.   09/16/2020 at Unknown time  . Multiple Vitamin (MULTI-VITAMIN) tablet Take 1 tablet by mouth daily.   09/16/2020 at Unknown time  . pantoprazole (PROTONIX) 40 MG tablet Take by mouth.   09/16/2020 at Unknown time  . Prenatal Vit-Fe Fumarate-FA (PRENATAL ONE DAILY PO) Take by mouth.   09/16/2020 at Unknown time     Allergies  Allergen Reactions  . Augmentin [Amoxicillin-Pot Clavulanate]   . Calcium-Containing Compounds   . Ciprofloxacin    . Clams [Shellfish Allergy]   . Contrast Media [Iodinated Diagnostic Agents]   . Flagyl [Metronidazole]   . Statins   . Tape     Adhesive tape-silicones     Past Medical History:  Diagnosis Date  . Allergy   . Anxiety   . Arthritis    osteoarthritis  . Cataract    surgery both eyes 04/22/2011 implants  . Depression   . Diverticulitis   . GERD (gastroesophageal reflux disease)   . Heart murmur   . History of cataract   . Hyperlipidemia   . Hypertension   . Osteoporosis   . Situational mixed anxiety and depressive disorder   . Sleep apnea     Review of systems:  Otherwise negative.    Physical Exam  Gen: Alert, oriented. Appears stated age.  HEENT: [place]/AT. PERRLA. Lungs: CTA, no wheezes. CV: RR nl S1, S2. Abd: soft, benign, no masses. BS+ Ext: No edema. Pulses 2+    Planned procedures: Proceed with colonoscopy. The patient understands the nature of the planned procedure, indications, risks, alternatives and potential complications including but not limited to bleeding, infection, perforation, damage to internal organs and possible oversedation/side effects from anesthesia. The patient agrees and gives consent to proceed.  Please refer to procedure notes for findings, recommendations and patient disposition/instructions.     Shannon [name], M.D. Gastroenterology  09/17/2020  10:56 AM

## 2020-09-17 NOTE — Anesthesia Preprocedure Evaluation (Signed)
Anesthesia Evaluation  Patient identified by MRN, date of birth, ID band Patient awake    Reviewed: Allergy & Precautions, H&P , NPO status , Patient's Chart, lab work & pertinent test results  Airway Mallampati: II  TM Distance: >3 FB Neck ROM: Full    Dental no notable dental hx. (+) Upper Dentures, Lower Dentures   Pulmonary sleep apnea and Continuous Positive Airway Pressure Ventilation , former smoker,    Pulmonary exam normal        Cardiovascular hypertension, Normal cardiovascular exam+ Valvular Problems/Murmurs      Neuro/Psych PSYCHIATRIC DISORDERS Anxiety Depression negative neurological ROS     GI/Hepatic Neg liver ROS, Bowel prep,GERD  Controlled,  Endo/Other  negative endocrine ROS  Renal/GU negative Renal ROS  negative genitourinary   Musculoskeletal  (+) Arthritis , Osteoarthritis,    Abdominal   Peds negative pediatric ROS (+)  Hematology negative hematology ROS (+)   Anesthesia Other Findings Essential hypertension - Primary  Functional diarrhea  Dizziness  Dizziness and giddiness  Mixed hyperlipidemia  Primary osteoarthritis of right knee  COVID-19 virus detected    Reproductive/Obstetrics negative OB ROS                             Anesthesia Physical Anesthesia Plan  ASA: II  Anesthesia Plan: General   Post-op Pain Management:    Induction: Intravenous  PONV Risk Score and Plan: 2 and Propofol infusion and TIVA  Airway Management Planned: Natural Airway and Nasal Cannula  Additional Equipment:   Intra-op Plan:   Post-operative Plan:   Informed Consent: I have reviewed the patients History and Physical, chart, labs and discussed the procedure including the risks, benefits and alternatives for the proposed anesthesia with the patient or authorized representative who has indicated his/her understanding and acceptance.       Plan Discussed  with: CRNA, Anesthesiologist and Surgeon  Anesthesia Plan Comments:         Anesthesia Quick Evaluation

## 2020-09-17 NOTE — Interval H&P Note (Signed)
History and Physical Interval Note:  09/17/2020 10:56 AM  Shannon [name]  has presented today for surgery, with the diagnosis of HX COLON POLYP.  The various methods of treatment have been discussed with the patient and family. After consideration of risks, benefits and other options for treatment, the patient has consented to  Procedure(s): COLONOSCOPY WITH PROPOFOL (N/A) as a surgical intervention.  The patient's history has been reviewed, patient examined, no change in status, stable for surgery.  I have reviewed the patient's chart and labs.  Questions were answered to the patient's satisfaction.     [place], [place]

## 2020-09-17 NOTE — Op Note (Signed)
[facility] Gastroenterology Patient Name: Shannon [name] Procedure Date: 09/17/2020 11:14 AM MRN: [id number] Account #: [id number] Date of Birth: Nov 18, 1940 Admit Type: Outpatient Age: 80 Room: [facility] ENDO ROOM 2 Gender: Female Note Status: Finalized Procedure:             Colonoscopy Indications:           High risk colon cancer surveillance: Personal history                         of colonic polyps Providers:             Boykin Nearing. Norma Fredrickson MD, MD Referring MD:          Myrle Sheng. Katrinka Blazing, MD (Referring MD) Medicines:             Propofol per Anesthesia Complications:         No immediate complications. Procedure:             Pre-Anesthesia Assessment:                        - The risks and benefits of the procedure and the                         sedation options and risks were discussed with the                         patient. All questions were answered and informed                         consent was obtained.                        - The risks and benefits of the procedure and the                         sedation options and risks were discussed with the                         patient. All questions were answered and informed                         consent was obtained.                        - The risks and benefits of the procedure and the                         sedation options and risks were discussed with the                         patient. All questions were answered and informed                         consent was obtained.                        - Patient identification and proposed procedure were                         verified prior  to the procedure by the nurse. The                         procedure was verified in the procedure room.                        - ASA Grade Assessment: III - A patient with severe                         systemic disease.                        - After reviewing the risks and benefits, the patient                          was deemed in satisfactory condition to undergo the                         procedure.                        After obtaining informed consent, the colonoscope was                         passed under direct vision. Throughout the procedure,                         the patient's blood pressure, pulse, and oxygen                         saturations were monitored continuously. The                         Colonoscope was introduced through the anus and                         advanced to the the cecum, identified by appendiceal                         orifice and ileocecal valve. The colonoscopy was                         performed without difficulty. The patient tolerated                         the procedure well. The quality of the bowel                         preparation was adequate. The ileocecal valve,                         appendiceal orifice, and rectum were photographed. Findings:      The perianal and digital rectal examinations were normal. Pertinent       negatives include normal sphincter tone and no palpable rectal lesions.      Non-bleeding internal hemorrhoids were found during retroflexion. The       hemorrhoids were Grade I (internal hemorrhoids that do not prolapse).      There was evidence of a prior functional end-to-end colo-colonic  anastomosis in the recto-sigmoid colon. This was patent and was       characterized by healthy appearing mucosa. The anastomosis was traversed.      Multiple small and large-mouthed diverticula were found in the entire       colon. There was no evidence of diverticular bleeding.      The exam was otherwise without abnormality. Impression:            - Non-bleeding internal hemorrhoids.                        - Patent functional end-to-end colo-colonic                         anastomosis, characterized by healthy appearing mucosa.                        - Moderate diverticulosis in the entire examined                          colon. There was no evidence of diverticular bleeding.                        - The examination was otherwise normal.                        - No specimens collected. Recommendation:        - Patient has a contact number available for                         emergencies. The signs and symptoms of potential                         delayed complications were discussed with the patient.                         Return to normal activities tomorrow. Written                         discharge instructions were provided to the patient.                        - Resume previous diet.                        - Continue present medications.                        - No repeat colonoscopy due to current age 82(66 years                         or older) and the absence of colonic polyps.                        - You do NOT require further colon cancer screening                         measures (Annual stool testing (i.e. hemoccult, FIT,  cologuard), sigmoidoscopy, colonoscopy or CT                         colonography). You should share this recommendation                         with your Primary Care provider.                        - Return to GI office PRN.                        - The findings and recommendations were discussed with                         the patient. Procedure Code(s):     --- Professional ---                        A5409, Colorectal cancer screening; colonoscopy on                         individual at high risk Diagnosis Code(s):     --- Professional ---                        K57.30, Diverticulosis of large intestine without                         perforation or abscess without bleeding                        K64.0, First degree hemorrhoids                        Z98.0, Intestinal bypass and anastomosis status                        Z86.010, Personal history of colonic polyps CPT copyright 2019 American Medical Association. All rights reserved. The codes  documented in this report are preliminary and upon coder review may  be revised to meet current compliance requirements. Stanton Kidney MD, MD 09/17/2020 11:46:25 AM This report has been signed electronically. Number of Addenda: 0 Note Initiated On: 09/17/2020 11:14 AM Scope Withdrawal Time: 0 hours 5 minutes 9 seconds  Total Procedure Duration: 0 hours 8 minutes 7 seconds  Estimated Blood Loss:  Estimated blood loss: none.      [facility]

## 2020-09-18 ENCOUNTER — Encounter: Payer: Self-pay | Admitting: Internal Medicine

## 2020-11-28 ENCOUNTER — Other Ambulatory Visit: Payer: Self-pay | Admitting: Family Medicine

## 2020-11-28 DIAGNOSIS — Z780 Asymptomatic menopausal state: Secondary | ICD-10-CM

## 2021-05-01 ENCOUNTER — Other Ambulatory Visit: Payer: Self-pay | Admitting: Family Medicine

## 2021-05-01 DIAGNOSIS — Z1231 Encounter for screening mammogram for malignant neoplasm of breast: Secondary | ICD-10-CM

## 2021-06-01 ENCOUNTER — Ambulatory Visit (INDEPENDENT_AMBULATORY_CARE_PROVIDER_SITE_OTHER): Payer: Medicare Other | Admitting: Internal Medicine

## 2021-06-01 ENCOUNTER — Other Ambulatory Visit: Payer: Self-pay

## 2021-06-01 VITALS — BP 147/63 | HR 62 | Resp 16 | Ht 64.0 in | Wt 155.0 lb

## 2021-06-01 DIAGNOSIS — Z7189 Other specified counseling: Secondary | ICD-10-CM | POA: Diagnosis not present

## 2021-06-01 DIAGNOSIS — Z9989 Dependence on other enabling machines and devices: Secondary | ICD-10-CM

## 2021-06-01 DIAGNOSIS — G4733 Obstructive sleep apnea (adult) (pediatric): Secondary | ICD-10-CM

## 2021-06-01 DIAGNOSIS — I10 Essential (primary) hypertension: Secondary | ICD-10-CM

## 2021-06-01 NOTE — Patient Instructions (Signed)

## 2021-06-01 NOTE — Progress Notes (Signed)
[facility] [address] [place], [place] [address]  Pulmonary Sleep Medicine   Office Visit Note  Patient Name: Shannon [name] DOB: 1940-10-08 MRN [id number]    Chief Complaint: Obstructive Sleep Apnea visit  Brief History:  Tyerra is seen today for one year follow up The patient has a 9 year history of sleep apnea. Patient is using PAP nightly @ 12 cmH2O.  The patient feels better after sleeping with PAP.  The patient reports benefiting from PAP use. Reported sleepiness is  improved and the Epworth Sleepiness Score is 5 out of 24. The patient does take 3-4 times weekly naps without CPAP 20 min - 1.5 hours. The patient complains of the following: pain level causing more people.  The compliance download shows  compliance with an average use time of 6:57 hours @ 99%. The AHI is 0.7  The patient does not complain of limb movements disrupting sleep.  ROS  General: (-) fever, (-) chills, (-) night sweat Nose and Sinuses: (-) nasal stuffiness or itchiness, (-) postnasal drip, (-) nosebleeds, (-) sinus trouble. Mouth and Throat: (-) sore throat, (-) hoarseness. Neck: (-) swollen glands, (-) enlarged thyroid, (-) neck pain. Respiratory: - cough, - shortness of breath, - wheezing. Neurologic: - numbness, - tingling. Psychiatric: - anxiety, - depression   Current Medication: Outpatient Encounter Medications as of 06/01/2021  Medication Sig   Apoaequorin (PREVAGEN PO) Take 1 capsule by mouth every evening.   calcium citrate-vitamin D (CITRACAL+D) 315-200 MG-UNIT tablet Take by mouth.   fluticasone (FLONASE) 50 MCG/ACT nasal spray Place into the nose.   gabapentin (NEURONTIN) 100 MG capsule Take 100 mg by mouth 3 (three) times daily.   hydrochlorothiazide (HYDRODIURIL) 25 MG tablet Take by mouth.   hydrocortisone (ANUSOL-HC) 25 MG suppository Place rectally.   ketotifen (ZADITOR) 0.025 % ophthalmic solution Apply to eye.   losartan (COZAAR) 100 MG tablet Take by mouth.    meloxicam (MOBIC) 15 MG tablet Take by mouth.   montelukast (SINGULAIR) 10 MG tablet Take by mouth.   Multiple Vitamin (MULTI-VITAMIN) tablet Take 1 tablet by mouth daily.   pantoprazole (PROTONIX) 40 MG tablet Take by mouth.   Prenatal Vit-Fe Fumarate-FA (PRENATAL ONE DAILY PO) Take by mouth.   No facility-administered encounter medications on file as of 06/01/2021.    Surgical History: Past Surgical History:  Procedure Laterality Date   ABDOMINAL HYSTERECTOMY     APPENDECTOMY     BLEPHAROPLASTY  2016   CATARACT EXTRACTION     CHOLECYSTECTOMY     COLONOSCOPY     COLONOSCOPY WITH PROPOFOL N/A 09/17/2020   Procedure: COLONOSCOPY WITH PROPOFOL;  Surgeon: Toledo, Boykin Nearing, MD;  Location: [facility] ENDOSCOPY;  Service: Gastroenterology;  Laterality: N/A;   HERNIA REPAIR     HIP ARTHROPLASTY Bilateral    JOINT REPLACEMENT     left hip replacement, right knee replacement   KNEE ARTHROSCOPY     LAPAROSCOPIC COLON RESECTION     OOPHORECTOMY     vein ligations      Medical History: Past Medical History:  Diagnosis Date   Allergy    Anxiety    Arthritis    osteoarthritis   Cataract    surgery both eyes 04/22/2011 implants   Depression    Diverticulitis    GERD (gastroesophageal reflux disease)    Heart murmur    History of cataract    Hyperlipidemia    Hypertension    Osteoporosis    Situational mixed anxiety and depressive disorder  Sleep apnea     Family History: Non contributory to the present illness  Social History: Social History   Socioeconomic History   Marital status: Divorced    Spouse name: Not on file   Number of children: 3   Years of education: Not on file   Highest education level: Not on file  Occupational History   Not on file  Tobacco Use   Smoking status: Former    Packs/day: 0.25    Years: 15.00    Pack years: 3.75    Types: Cigarettes    Quit date: 08/09/1968    Years since quitting: 52.8   Smokeless tobacco: Never  Substance and Sexual  Activity   Alcohol use: Never   Drug use: Never   Sexual activity: Not Currently  Other Topics Concern   Not on file  Social History Narrative   Not on file   Social Determinants of Health   Financial Resource Strain: Not on file  Food Insecurity: Not on file  Transportation Needs: Not on file  Physical Activity: Not on file  Stress: Not on file  Social Connections: Not on file  Intimate Partner Violence: Not on file    Vital Signs: Blood pressure (!) 147/63, pulse 62, resp. rate 16, height 5\' 4"  (1.626 m), weight 155 lb (70.3 kg), SpO2 96 %.  Examination: General Appearance: The patient is well-developed, well-nourished, and in no distress. Neck Circumference: 35 cm Skin: Gross inspection of skin unremarkable. Head: normocephalic, no gross deformities. Eyes: no gross deformities noted. ENT: ears appear grossly normal Neurologic: Alert and oriented. No involuntary movements.    EPWORTH SLEEPINESS SCALE:  Scale:  (0)= no chance of dozing; (1)= slight chance of dozing; (2)= moderate chance of dozing; (3)= high chance of dozing  Chance  Situtation    Sitting and reading: 0    Watching TV: 2    Sitting Inactive in public: 0    As a passenger in car: 0      Lying down to rest: 1    Sitting and talking: 0    Sitting quielty after lunch: 2    In a car, stopped in traffic: 0   TOTAL SCORE:   5 out of 24    SLEEP STUDIES:  Split 05/23/12 AHI 14 SpO93min 81%   CPAP COMPLIANCE DATA:  Date Range: 05/27/20 - 05/26/21  Average Daily Use: 6:57 hours  Median Use: 6:56 hours  Compliance for > 4 Hours: 99%  AHI: 0.7 respiratory events per hour  Days Used: 365/365  Mask Leak: 42.1 lpm  95th Percentile Pressure: 12 cmH2O    LABS: No results found for this or any previous visit (from the past 2160 hour(s)).  Radiology: No results found.  No results found.  No results found.    Assessment and Plan: Patient Active Problem List   Diagnosis  Date Noted   OSA on CPAP 06/02/2020   CPAP use counseling 06/02/2020   Seasonal allergies 06/02/2020   Essential hypertension 12/25/2015    1. OSA on CPAP The patient does tolerate PAP and reports  benefit from PAP use. The patient was reminded how to clean equipment and advised to replace supplies routinely. The patient was also counselled on weight loss. The compliance is excellent. The AHI is 0.7.   OSA- Continue excellent compliance. Mask fit offered- pt declines.    2. CPAP use counseling CPAP Counseling: had a lengthy discussion with the patient regarding the importance of PAP therapy in management of  the sleep apnea. Patient appears to understand the risk factor reduction and also understands the risks associated with untreated sleep apnea. Patient will try to make a good faith effort to remain compliant with therapy. Also instructed the patient on proper cleaning of the device including the water must be changed daily if possible and use of distilled water is preferred. Patient understands that the machine should be regularly cleaned with appropriate recommended cleaning solutions that do not damage the PAP machine for example given white vinegar and water rinses. Other methods such as ozone treatment may not be as good as these simple methods to achieve cleaning.   3. Essential hypertension Hypertension Counseling:   The following hypertensive lifestyle modification were recommended and discussed:  1. Limiting alcohol intake to less than 1 oz/day of ethanol:(24 oz of beer or 8 oz of wine or 2 oz of 100-proof whiskey). 2. Take baby ASA 81 mg daily. 3. Importance of regular aerobic exercise and losing weight. 4. Reduce dietary saturated fat and cholesterol intake for overall cardiovascular health. 5. Maintaining adequate dietary potassium, calcium, and magnesium intake. 6. Regular monitoring of the blood pressure. 7. Reduce sodium intake to less than 100 mmol/day (less than 2.3 gm of  sodium or less than 6 gm of sodium choride)      General Counseling: I have discussed the findings of the evaluation and examination with Bonita Quin.  I have also discussed any further diagnostic evaluation thatmay be needed or ordered today. Felisia verbalizes understanding of the findings of todays visit. We also reviewed her medications today and discussed drug interactions and side effects including but not limited excessive drowsiness and altered mental states. We also discussed that there is always a risk not just to her but also people around her. she has been encouraged to call the office with any questions or concerns that should arise related to todays visit.  No orders of the defined types were placed in this encounter.       I have personally obtained a history, examined the patient, evaluated laboratory and imaging results, formulated the assessment and plan and placed orders. This patient was seen today by Emmaline Kluver, PA-C in collaboration with Dr. Freda Munro.   Yevonne Pax, MD [facility] Diplomate ABMS Pulmonary Critical Care Medicine and Sleep Medicine

## 2021-07-09 ENCOUNTER — Ambulatory Visit
Admission: RE | Admit: 2021-07-09 | Discharge: 2021-07-09 | Disposition: A | Discharge status: Home / Self Care | Payer: Medicare Other | Source: Ambulatory Visit | Attending: Family Medicine | Admitting: Family Medicine

## 2021-07-09 ENCOUNTER — Other Ambulatory Visit: Payer: Self-pay

## 2021-07-09 DIAGNOSIS — Z1231 Encounter for screening mammogram for malignant neoplasm of breast: Secondary | ICD-10-CM

## 2021-07-09 DIAGNOSIS — Z780 Asymptomatic menopausal state: Secondary | ICD-10-CM | POA: Diagnosis present

## 2021-07-16 ENCOUNTER — Other Ambulatory Visit: Payer: Self-pay | Admitting: Family Medicine

## 2021-07-16 DIAGNOSIS — R928 Other abnormal and inconclusive findings on diagnostic imaging of breast: Secondary | ICD-10-CM

## 2021-07-17 ENCOUNTER — Encounter: Payer: Self-pay | Admitting: Internal Medicine

## 2021-07-24 ENCOUNTER — Ambulatory Visit
Admission: RE | Admit: 2021-07-24 | Discharge: 2021-07-24 | Disposition: A | Discharge status: Home / Self Care | Payer: Medicare Other | Source: Ambulatory Visit | Attending: Family Medicine | Admitting: Family Medicine

## 2021-07-24 ENCOUNTER — Other Ambulatory Visit: Payer: Self-pay

## 2021-07-24 DIAGNOSIS — R928 Other abnormal and inconclusive findings on diagnostic imaging of breast: Secondary | ICD-10-CM | POA: Diagnosis not present

## 2021-07-26 DIAGNOSIS — I639 Cerebral infarction, unspecified: Secondary | ICD-10-CM

## 2021-07-26 HISTORY — DX: Cerebral infarction, unspecified: I63.9

## 2022-01-12 ENCOUNTER — Encounter: Payer: Self-pay | Admitting: Orthopedic Surgery

## 2022-01-12 ENCOUNTER — Other Ambulatory Visit: Payer: Self-pay | Admitting: Orthopedic Surgery

## 2022-01-12 DIAGNOSIS — Z01818 Encounter for other preprocedural examination: Secondary | ICD-10-CM

## 2022-01-12 NOTE — H&P (Signed)
NAME: Shannon [name] MRN:   [id number] DOB:   09-12-1940     HISTORY AND PHYSICAL  CHIEF COMPLAINT:  right hip pain  HISTORY:   BLONDINA CODERRE a 81 y.o. female  with right  Hip Pain Patient complains of right hip pain. Onset of the symptoms was several years ago. Inciting event: known DJD. The patient reports the hip pain is worse with weight bearing. Associated symptoms: none. Aggravating symptoms include: any weight bearing. Patient has had prior hip problems. Previous visits for this problem: multiple, this is a longstanding diagnosis. Last seen several weeks ago by me. Evaluation to date: plain films, which were abnormal  osteoarthritis . Treatment to date: OTC analgesics, which have been somewhat effective, prescription analgesics, which have been somewhat effective, and home exercise program, which has been somewhat effective.      Plan for right total hip replacement  PAST MEDICAL HISTORY:   Past Medical History:  Diagnosis Date   Allergy    Anxiety    Arthritis    osteoarthritis   Cataract    surgery both eyes 04/22/2011 implants   Depression    Diverticulitis    GERD (gastroesophageal reflux disease)    Heart murmur    History of cataract    Hyperlipidemia    Hypertension    Osteoporosis    Situational mixed anxiety and depressive disorder    Sleep apnea     PAST SURGICAL HISTORY:   Past Surgical History:  Procedure Laterality Date   ABDOMINAL HYSTERECTOMY     APPENDECTOMY     BLEPHAROPLASTY  2016   CATARACT EXTRACTION     CHOLECYSTECTOMY     COLONOSCOPY     COLONOSCOPY WITH PROPOFOL N/A 09/17/2020   Procedure: COLONOSCOPY WITH PROPOFOL;  Surgeon: Toledo, Boykin Nearing, MD;  Location: [facility] ENDOSCOPY;  Service: Gastroenterology;  Laterality: N/A;   HERNIA REPAIR     HIP ARTHROPLASTY Bilateral    JOINT REPLACEMENT     left hip replacement, right knee replacement   KNEE ARTHROSCOPY     LAPAROSCOPIC COLON RESECTION     OOPHORECTOMY     vein ligations       MEDICATIONS:  (Not in a hospital admission)   ALLERGIES:   Allergies  Allergen Reactions   Augmentin [Amoxicillin-Pot Clavulanate]    Calcium-Containing Compounds    Ciprofloxacin    Clams [Shellfish Allergy]    Contrast Media [Iodinated Contrast Media]    Flagyl [Metronidazole]    Statins    Tape     Adhesive tape-silicones    REVIEW OF SYSTEMS:   Negative except HPI  FAMILY HISTORY:   Family History  Problem Relation Age of Onset   Cancer Father    Osteoarthritis Father    Ulcers Father    Cancer Sister        liver, lungs   Arthritis Sister        rheumatoid   Hypertension Sister    Hyperlipidemia Sister    Diabetes Sister    GI Disease Sister    Stroke Sister    Liver disease Sister    Arthritis Sister    Diabetes Brother    Osteoarthritis Brother    Skin cancer Brother    Cancer Brother        pancreatic, kidney   Breast cancer Niece        late 83's   Breast cancer Niece     SOCIAL HISTORY:   reports that she quit smoking about 69  years ago. Her smoking use included cigarettes. She has a 3.75 pack-year smoking history. She has never used smokeless tobacco. She reports that she does not drink alcohol and does not use drugs.  PHYSICAL EXAM:  General appearance: alert, cooperative, and no distress Neck: no JVD and supple, symmetrical, trachea midline Resp: clear to auscultation bilaterally Cardio: regular rate and rhythm, S1, S2 normal, no murmur, click, rub or gallop GI: soft, non-tender; bowel sounds normal; no masses,  no organomegaly Extremities: extremities normal, atraumatic, no cyanosis or edema and Homans sign is negative, no sign of DVT Pulses: 2+ and symmetric Skin: Skin color, texture, turgor normal. No rashes or lesions Neurologic: Alert and oriented X 3, normal strength and tone. Normal symmetric reflexes. Normal coordination and gait    LABORATORY STUDIES: No results for input(s): "WBC", "HGB", "HCT", "PLT" in the last 72 hours.  No  results for input(s): "NA", "K", "CL", "CO2", "GLUCOSE", "BUN", "CREATININE", "CALCIUM" in the last 72 hours.  STUDIES/RESULTS:  No results found.  ASSESSMENT:  End stage osteoarthritis right hip        Active Problems:   * No active hospital problems. *    PLAN:  Right Primary Total Hip   Altamese Cabal 01/12/2022. 9:09 AM

## 2022-01-14 ENCOUNTER — Encounter
Admission: RE | Admit: 2022-01-14 | Discharge: 2022-01-14 | Disposition: A | Discharge status: Home / Self Care | Payer: Medicare Other | Source: Ambulatory Visit | Attending: Orthopedic Surgery | Admitting: Orthopedic Surgery

## 2022-01-14 ENCOUNTER — Other Ambulatory Visit: Payer: Self-pay | Admitting: Orthopedic Surgery

## 2022-01-14 HISTORY — DX: Essential (primary) hypertension: I10

## 2022-01-14 NOTE — Patient Instructions (Addendum)
Your procedure is scheduled on: Wednesday, June 28 Report to the Registration Desk on the 1st floor of the [facility] To find out your arrival time, please call [phone] between 1PM - 3PM on: Tuesday, June 27 If your arrival time is 6:00 am, do not arrive prior to that time as the [facility] entrance doors do not open until 6:00 am.  REMEMBER: Instructions that are not followed completely may result in serious medical risk, up to and including death; or upon the discretion of your surgeon and anesthesiologist your surgery may need to be rescheduled.  Do not eat or drink after midnight the night before surgery.  No gum chewing, lozengers or hard candies.  TAKE THESE MEDICATIONS THE MORNING OF SURGERY WITH A SIP OF WATER:  Pantoprazole (Protonix) - (take one the night before and one on the morning of surgery - helps to prevent nausea after surgery.)  One week prior to surgery: starting today, June 22 Stop meloxicam and Anti-inflammatories (NSAIDS) such as Advil, Aleve, Ibuprofen, Motrin, Naproxen, Naprosyn and Aspirin based products such as Excedrin, Goodys Powder, BC Powder. Stop ANY OVER THE COUNTER supplements until after surgery. Stop prevagen, prenatal vitamins You may however, continue to take Tylenol if needed for pain up until the day of surgery.  Continue taking all other prescribed medications up until the day of surgery.  No Alcohol for 24 hours before or after surgery.  No Smoking including e-cigarettes for 24 hours prior to surgery.   On the morning of surgery brush your teeth with toothpaste and water, you may rinse your mouth with mouthwash if you wish. Do not swallow any toothpaste or mouthwash.  Use CHG Soap as directed on instruction sheet.  Do not wear jewelry, make-up, hairpins, clips or nail polish.  Do not wear lotions, powders, or perfumes.   Do not shave body from the neck down 48 hours prior to surgery just in case you cut yourself which could  leave a site for infection.  Also, freshly shaved skin may become irritated if using the CHG soap.  Contact lenses, hearing aids and dentures may not be worn into surgery.  Do not bring valuables to the hospital. [facility] is not responsible for any missing/lost belongings or valuables.   Bring your C-PAP to the hospital with you in case you may have to spend the night.   Notify your doctor if there is any change in your medical condition (cold, fever, infection).  Wear comfortable clothing (specific to your surgery type) to the hospital.  After surgery, you can help prevent lung complications by doing breathing exercises.  Take deep breaths and cough every 1-2 hours. Your doctor may order a device called an Incentive Spirometer to help you take deep breaths.  If you are being admitted to the hospital overnight, leave your suitcase in the car. After surgery it may be brought to your room.  Please call the Pre-admissions Testing Dept. at [phone] if you have any questions about these instructions.  Surgery Visitation Policy:  Patients undergoing a surgery or procedure may have two family members or support persons with them as long as the person is not COVID-19 positive or experiencing its symptoms.   Inpatient Visitation:    Visiting hours are 7 a.m. to 8 p.m. Up to four visitors are allowed at one time in a patient room, including children. The visitors may rotate out with other people during the day. One designated support person (adult) may remain overnight.  Preparing for Surgery with CHLORHEXIDINE GLUCONATE (CHG) Soap    Before surgery, you can play an important role by reducing the number of germs on your skin.  CHG (Chlorhexidine gluconate) soap is an antiseptic cleanser which kills germs and bonds with the skin to continue killing germs even after washing.  Please do not use if you have an allergy to CHG or antibacterial soaps. If your skin becomes  reddened/irritated stop using the CHG.  1. Shower the NIGHT BEFORE SURGERY and the MORNING OF SURGERY with CHG soap.  2. If you choose to wash your hair, wash your hair first as usual with your normal shampoo.  3. After shampooing, rinse your hair and body thoroughly to remove the shampoo.  4. Use CHG as you would any other liquid soap. You can apply CHG directly to the skin and wash gently with a scrungie or a clean washcloth.  5. Apply the CHG soap to your body only from the neck down. Do not use on open wounds or open sores. Avoid contact with your eyes, ears, mouth, and genitals (private parts). Wash face and genitals (private parts) with your normal soap.  6. Wash thoroughly, paying special attention to the area where your surgery will be performed.  7. Thoroughly rinse your body with warm water.  8. Do not shower/wash with your normal soap after using and rinsing off the CHG soap.  9. Pat yourself dry with a clean towel.  10. Wear clean pajamas to bed the night before surgery.  12. Place clean sheets on your bed the night of your first shower and do not sleep with pets.  13. Shower again with the CHG soap on the day of surgery prior to arriving at the hospital.  14. Do not apply any deodorants/lotions/powders.  15. Please wear clean clothes to the hospital.

## 2022-01-14 NOTE — Pre-Procedure Instructions (Signed)
Copy and pasted from Dr. Lidia Collum of [facility] 01/01/2022 note:   81 year old female without significant prior cardiac history She is awaiting right hip replacement scheduled in 1 week Her electrocardiogram demonstrates sinus rhythm nonspecific T changes unchanged since 2019 (and prior to that while living in [place]) She has good functional tolerance greater than 4 METS without signs or symptoms that would suggest ischemia or volume overload Last ischemic evaluation 2017 negative with preserved LV systolic function at that time as well No additional cardiovascular work-up indicated She is at moderate perioperative risk based on her age but otherwise optimized from a cardiovascular standpoint

## 2022-01-15 ENCOUNTER — Encounter
Admission: RE | Admit: 2022-01-15 | Discharge: 2022-01-15 | Disposition: A | Discharge status: Home / Self Care | Payer: Medicare Other | Source: Ambulatory Visit | Attending: Orthopedic Surgery | Admitting: Orthopedic Surgery

## 2022-01-15 DIAGNOSIS — Z01812 Encounter for preprocedural laboratory examination: Secondary | ICD-10-CM | POA: Insufficient documentation

## 2022-01-15 DIAGNOSIS — Z01818 Encounter for other preprocedural examination: Secondary | ICD-10-CM

## 2022-01-15 LAB — BASIC METABOLIC PANEL
Anion gap: 9 (ref 5–15)
BUN: 12 mg/dL (ref 8–23)
CO2: 27 mmol/L (ref 22–32)
Calcium: 9.6 mg/dL (ref 8.9–10.3)
Chloride: 104 mmol/L (ref 98–111)
Creatinine, Ser: 0.69 mg/dL (ref 0.44–1.00)
GFR, Estimated: >60 mL/min (ref >60)
Glucose, Bld: 102 mg/dL — ABNORMAL HIGH (ref 70–99)
Potassium: 3.5 mmol/L (ref 3.5–5.1)
Sodium: 140 mmol/L (ref 135–145)

## 2022-01-15 LAB — URINALYSIS, ROUTINE W REFLEX MICROSCOPIC
Bilirubin Urine: NEGATIVE
Glucose, UA: NEGATIVE mg/dL
Hgb urine dipstick: NEGATIVE
Ketones, ur: 5 mg/dL — AB
Leukocytes,Ua: NEGATIVE
Nitrite: NEGATIVE
Protein, ur: NEGATIVE mg/dL
Specific Gravity, Urine: 1.014 (ref 1.005–1.030)
pH: 5 (ref 5.0–8.0)

## 2022-01-15 LAB — CBC
HCT: 41.7 % (ref 36.0–46.0)
Hemoglobin: 14.3 g/dL (ref 12.0–15.0)
MCH: 32.5 pg (ref 26.0–34.0)
MCHC: 34.3 g/dL (ref 30.0–36.0)
MCV: 94.8 fL (ref 80.0–100.0)
Platelets: 312 10*3/uL (ref 150–400)
RBC: 4.4 MIL/uL (ref 3.87–5.11)
RDW: 12.7 % (ref 11.5–15.5)
WBC: 6.9 10*3/uL (ref 4.0–10.5)
nRBC: 0 % (ref 0.0–0.2)

## 2022-01-15 LAB — TYPE AND SCREEN
ABO/RH(D): A POS
Antibody Screen: NEGATIVE

## 2022-01-15 LAB — SURGICAL PCR SCREEN
MRSA, PCR: NEGATIVE
Staphylococcus aureus: NEGATIVE

## 2022-01-19 NOTE — Anesthesia Preprocedure Evaluation (Addendum)
Anesthesia Evaluation  Patient identified by MRN, date of birth, ID band Patient awake    Reviewed: Allergy & Precautions, H&P , NPO status , Patient's Chart, lab work & pertinent test results  Airway Mallampati: II  TM Distance: >3 FB Neck ROM: Full    Dental  (+) Upper Dentures, Lower Dentures   Pulmonary sleep apnea and Continuous Positive Airway Pressure Ventilation , former smoker,    Pulmonary exam normal        Cardiovascular Exercise Tolerance: Good hypertension, Pt. on medications Normal cardiovascular exam+ Valvular Problems/Murmurs      Neuro/Psych PSYCHIATRIC DISORDERS Anxiety Depression R lateral calf nueropathy    GI/Hepatic Neg liver ROS, hiatal hernia, Bowel prep,GERD  Controlled and Medicated,  Endo/Other  negative endocrine ROS  Renal/GU negative Renal ROS  negative genitourinary   Musculoskeletal  (+) Arthritis , Osteoarthritis,    Abdominal Normal abdominal exam  (+)   Peds negative pediatric ROS (+)  Hematology negative hematology ROS (+)   Anesthesia Other Findings Essential hypertension - Primary  Functional diarrhea  Dizziness  Dizziness and giddiness  Mixed hyperlipidemia  Primary osteoarthritis of right knee  COVID-19 virus detected    Reproductive/Obstetrics negative OB ROS                           Anesthesia Physical  Anesthesia Plan  ASA: II  Anesthesia Plan: Spinal   Post-op Pain Management: Tylenol PO (pre-op)*, Toradol IV (intra-op)* and Regional block*   Induction: Intravenous  PONV Risk Score and Plan: 2 and Propofol infusion, TIVA, Dexamethasone and Ondansetron  Airway Management Planned: Natural Airway and Nasal Cannula  Additional Equipment:   Intra-op Plan:   Post-operative Plan:   Informed Consent: I have reviewed the patients History and Physical, chart, labs and discussed the procedure including the risks, benefits and  alternatives for the proposed anesthesia with the patient or authorized representative who has indicated his/her understanding and acceptance.     Dental advisory given  Plan Discussed with: CRNA, Anesthesiologist and Surgeon  Anesthesia Plan Comments:        Anesthesia Quick Evaluation

## 2022-01-20 ENCOUNTER — Observation Stay
Admission: RE | Admit: 2022-01-20 | Discharge: 2022-01-21 | Disposition: A | Discharge status: Home / Self Care | Payer: Medicare Other | Attending: Orthopedic Surgery | Admitting: Orthopedic Surgery

## 2022-01-20 ENCOUNTER — Ambulatory Visit: Payer: Medicare Other | Admitting: Anesthesiology

## 2022-01-20 ENCOUNTER — Encounter: Payer: Self-pay | Admitting: Orthopedic Surgery

## 2022-01-20 ENCOUNTER — Other Ambulatory Visit: Payer: Self-pay

## 2022-01-20 ENCOUNTER — Ambulatory Visit: Payer: Medicare Other

## 2022-01-20 ENCOUNTER — Encounter
Admission: RE | Disposition: A | Discharge status: Home / Self Care | Payer: Self-pay | Source: Home / Self Care | Attending: Orthopedic Surgery

## 2022-01-20 DIAGNOSIS — M1611 Unilateral primary osteoarthritis, right hip: Principal | ICD-10-CM | POA: Insufficient documentation

## 2022-01-20 DIAGNOSIS — I10 Essential (primary) hypertension: Secondary | ICD-10-CM | POA: Insufficient documentation

## 2022-01-20 DIAGNOSIS — Z96641 Presence of right artificial hip joint: Secondary | ICD-10-CM

## 2022-01-20 DIAGNOSIS — Z01812 Encounter for preprocedural laboratory examination: Secondary | ICD-10-CM

## 2022-01-20 HISTORY — PX: TOTAL HIP ARTHROPLASTY: SHX124

## 2022-01-20 LAB — ABO/RH: ABO/RH(D): A POS

## 2022-01-20 SURGERY — ARTHROPLASTY, HIP, TOTAL, ANTERIOR APPROACH
Anesthesia: Spinal | Site: Hip | Laterality: Right

## 2022-01-20 MED ORDER — MONTELUKAST SODIUM 10 MG PO TABS
10.0000 mg | ORAL_TABLET | Freq: Every day | ORAL | Status: DC
  Administered 2022-01-20: 10 mg via ORAL
  Filled 2022-01-20: qty 1

## 2022-01-20 MED ORDER — FENTANYL CITRATE (PF) 100 MCG/2ML IJ SOLN: 25.0000 ug | INTRAMUSCULAR | Status: DC | PRN

## 2022-01-20 MED ORDER — BISACODYL 10 MG RE SUPP: 10.0000 mg | Freq: Every day | RECTAL | Status: DC | PRN

## 2022-01-20 MED ORDER — MENTHOL 3 MG MT LOZG: 1.0000 | LOZENGE | OROMUCOSAL | Status: DC | PRN

## 2022-01-20 MED ORDER — VALACYCLOVIR HCL 500 MG PO TABS: 1000.0000 mg | ORAL_TABLET | Freq: Two times a day (BID) | ORAL | Status: DC | PRN

## 2022-01-20 MED ORDER — BUPIVACAINE-EPINEPHRINE (PF) 0.25% -1:200000 IJ SOLN
INTRAMUSCULAR | Status: AC
  Filled 2022-01-20: qty 30

## 2022-01-20 MED ORDER — HYDROCODONE-ACETAMINOPHEN 5-325 MG PO TABS
ORAL_TABLET | ORAL | Status: AC
  Filled 2022-01-20: qty 2

## 2022-01-20 MED ORDER — DOCUSATE SODIUM 100 MG PO CAPS
100.0000 mg | ORAL_CAPSULE | Freq: Two times a day (BID) | ORAL | Status: DC
  Administered 2022-01-20 – 2022-01-21 (×3): 100 mg via ORAL
  Filled 2022-01-20 (×3): qty 1

## 2022-01-20 MED ORDER — CEFAZOLIN SODIUM-DEXTROSE 2-4 GM/100ML-% IV SOLN
INTRAVENOUS | Status: AC
  Filled 2022-01-20: qty 100

## 2022-01-20 MED ORDER — PHENOL 1.4 % MT LIQD: 1.0000 | OROMUCOSAL | Status: DC | PRN

## 2022-01-20 MED ORDER — CHLORHEXIDINE GLUCONATE 0.12 % MT SOLN: 15.0000 mL | Freq: Once | OROMUCOSAL | Status: AC

## 2022-01-20 MED ORDER — LOSARTAN POTASSIUM 50 MG PO TABS
100.0000 mg | ORAL_TABLET | Freq: Every day | ORAL | Status: DC
  Administered 2022-01-21: 100 mg via ORAL
  Filled 2022-01-20: qty 2

## 2022-01-20 MED ORDER — METOCLOPRAMIDE HCL 5 MG PO TABS: 5.0000 mg | ORAL_TABLET | Freq: Three times a day (TID) | ORAL | Status: DC | PRN

## 2022-01-20 MED ORDER — PROPOFOL 500 MG/50ML IV EMUL
INTRAVENOUS | Status: DC | PRN
  Administered 2022-01-20: 100 ug/kg/min via INTRAVENOUS

## 2022-01-20 MED ORDER — BUPIVACAINE-EPINEPHRINE (PF) 0.25% -1:200000 IJ SOLN
INTRAMUSCULAR | Status: DC | PRN
  Administered 2022-01-20: 30 mL

## 2022-01-20 MED ORDER — ASPIRIN 81 MG PO CHEW
81.0000 mg | CHEWABLE_TABLET | Freq: Two times a day (BID) | ORAL | Status: DC
  Administered 2022-01-20 – 2022-01-21 (×2): 81 mg via ORAL
  Filled 2022-01-20 (×2): qty 1

## 2022-01-20 MED ORDER — PROMETHAZINE HCL 25 MG/ML IJ SOLN: 6.2500 mg | INTRAMUSCULAR | Status: DC | PRN

## 2022-01-20 MED ORDER — KETOTIFEN FUMARATE 0.025 % OP SOLN: 1.0000 [drp] | OPHTHALMIC | Status: DC | PRN

## 2022-01-20 MED ORDER — PANTOPRAZOLE SODIUM 40 MG PO TBEC
40.0000 mg | DELAYED_RELEASE_TABLET | ORAL | Status: DC
  Administered 2022-01-21: 40 mg via ORAL
  Filled 2022-01-20: qty 1

## 2022-01-20 MED ORDER — OXYCODONE HCL 5 MG PO TABS
ORAL_TABLET | ORAL | Status: AC
  Filled 2022-01-20: qty 1

## 2022-01-20 MED ORDER — METOCLOPRAMIDE HCL 5 MG/ML IJ SOLN: 5.0000 mg | Freq: Three times a day (TID) | INTRAMUSCULAR | Status: DC | PRN

## 2022-01-20 MED ORDER — SODIUM CHLORIDE 0.9 % IV SOLN
INTRAVENOUS | Status: AC | PRN
  Administered 2022-01-20: 250 mL via INTRAMUSCULAR

## 2022-01-20 MED ORDER — FLUTICASONE PROPIONATE 50 MCG/ACT NA SUSP: 1.0000 | Freq: Every evening | NASAL | Status: DC | PRN

## 2022-01-20 MED ORDER — PHENYLEPHRINE HCL-NACL 20-0.9 MG/250ML-% IV SOLN
INTRAVENOUS | Status: DC | PRN
  Administered 2022-01-20: 20 ug/min via INTRAVENOUS

## 2022-01-20 MED ORDER — HYDROCODONE-ACETAMINOPHEN 7.5-325 MG PO TABS
1.0000 | ORAL_TABLET | ORAL | Status: DC | PRN
  Administered 2022-01-21: 2 via ORAL
  Filled 2022-01-20: qty 2

## 2022-01-20 MED ORDER — FENTANYL CITRATE (PF) 100 MCG/2ML IJ SOLN
INTRAMUSCULAR | Status: DC | PRN
  Administered 2022-01-20: 50 ug via INTRAVENOUS
  Administered 2022-01-20 (×2): 25 ug via INTRAVENOUS

## 2022-01-20 MED ORDER — CHLORHEXIDINE GLUCONATE 0.12 % MT SOLN
OROMUCOSAL | Status: AC
  Administered 2022-01-20: 15 mL via OROMUCOSAL
  Filled 2022-01-20: qty 15

## 2022-01-20 MED ORDER — ACETAMINOPHEN 325 MG PO TABS: 325.0000 mg | ORAL_TABLET | Freq: Four times a day (QID) | ORAL | Status: DC | PRN

## 2022-01-20 MED ORDER — ORAL CARE MOUTH RINSE: 15.0000 mL | Freq: Once | OROMUCOSAL | Status: AC

## 2022-01-20 MED ORDER — HYDROCODONE-ACETAMINOPHEN 5-325 MG PO TABS
1.0000 | ORAL_TABLET | ORAL | Status: DC | PRN
  Administered 2022-01-20 (×2): 2 via ORAL
  Administered 2022-01-20: 1 via ORAL
  Filled 2022-01-20 (×2): qty 2

## 2022-01-20 MED ORDER — ONDANSETRON HCL 4 MG PO TABS
4.0000 mg | ORAL_TABLET | Freq: Four times a day (QID) | ORAL | Status: DC | PRN
  Administered 2022-01-20: 4 mg via ORAL
  Filled 2022-01-20: qty 1

## 2022-01-20 MED ORDER — MAGNESIUM HYDROXIDE 400 MG/5ML PO SUSP
30.0000 mL | Freq: Every day | ORAL | Status: DC | PRN
Start: 2022-01-20 — End: 2022-01-21

## 2022-01-20 MED ORDER — VITAMIN B-12 1000 MCG PO TABS
1000.0000 ug | ORAL_TABLET | Freq: Every day | ORAL | Status: DC
  Administered 2022-01-21: 1000 ug via ORAL
  Filled 2022-01-20: qty 1

## 2022-01-20 MED ORDER — LIDOCAINE HCL (CARDIAC) PF 100 MG/5ML IV SOSY
PREFILLED_SYRINGE | INTRAVENOUS | Status: DC | PRN
  Administered 2022-01-20: 60 mg via INTRAVENOUS

## 2022-01-20 MED ORDER — DEXAMETHASONE SODIUM PHOSPHATE 10 MG/ML IJ SOLN
INTRAMUSCULAR | Status: DC | PRN
  Administered 2022-01-20: 5 mg via INTRAVENOUS

## 2022-01-20 MED ORDER — MORPHINE SULFATE (PF) 4 MG/ML IV SOLN
0.5000 mg | INTRAVENOUS | Status: DC | PRN
  Administered 2022-01-21: 1 mg via INTRAVENOUS
  Filled 2022-01-20: qty 1

## 2022-01-20 MED ORDER — ALUM & MAG HYDROXIDE-SIMETH 200-200-20 MG/5ML PO SUSP: 30.0000 mL | ORAL | Status: DC | PRN

## 2022-01-20 MED ORDER — KETAMINE HCL 10 MG/ML IJ SOLN
INTRAMUSCULAR | Status: DC | PRN
  Administered 2022-01-20: 20 mg via INTRAVENOUS

## 2022-01-20 MED ORDER — BUPIVACAINE HCL (PF) 0.5 % IJ SOLN
INTRAMUSCULAR | Status: DC | PRN
  Administered 2022-01-20: 2.5 mL

## 2022-01-20 MED ORDER — KETOROLAC TROMETHAMINE 30 MG/ML IJ SOLN
INTRAMUSCULAR | Status: AC
Start: 2022-01-20 — End: ?
  Filled 2022-01-20: qty 1

## 2022-01-20 MED ORDER — OXYCODONE HCL 5 MG PO TABS
5.0000 mg | ORAL_TABLET | Freq: Once | ORAL | Status: AC | PRN
  Administered 2022-01-20: 5 mg via ORAL

## 2022-01-20 MED ORDER — HYDROCHLOROTHIAZIDE 12.5 MG PO TABS
12.5000 mg | ORAL_TABLET | Freq: Every day | ORAL | Status: DC
  Administered 2022-01-21: 12.5 mg via ORAL
  Filled 2022-01-20: qty 1

## 2022-01-20 MED ORDER — TRANEXAMIC ACID-NACL 1000-0.7 MG/100ML-% IV SOLN
1000.0000 mg | INTRAVENOUS | Status: AC
  Administered 2022-01-20: 1000 mg via INTRAVENOUS

## 2022-01-20 MED ORDER — PROPOFOL 1000 MG/100ML IV EMUL
INTRAVENOUS | Status: AC
  Filled 2022-01-20: qty 100

## 2022-01-20 MED ORDER — GLYCOPYRROLATE 0.2 MG/ML IJ SOLN
INTRAMUSCULAR | Status: DC | PRN
  Administered 2022-01-20: .2 mg via INTRAVENOUS

## 2022-01-20 MED ORDER — ACETAMINOPHEN 500 MG PO TABS: 1000.0000 mg | ORAL_TABLET | Freq: Once | ORAL | Status: AC

## 2022-01-20 MED ORDER — SODIUM CHLORIDE 0.9 % IV SOLN: INTRAVENOUS | Status: DC

## 2022-01-20 MED ORDER — SODIUM CHLORIDE 0.9 % IR SOLN
Status: DC | PRN
  Administered 2022-01-20: 3000 mL

## 2022-01-20 MED ORDER — HYDROCORTISONE ACETATE 25 MG RE SUPP: 25.0000 mg | Freq: Two times a day (BID) | RECTAL | Status: DC | PRN

## 2022-01-20 MED ORDER — ONDANSETRON HCL 4 MG/2ML IJ SOLN
INTRAMUSCULAR | Status: DC | PRN
  Administered 2022-01-20: 4 mg via INTRAVENOUS

## 2022-01-20 MED ORDER — APOAEQUORIN 20 MG PO CAPS: 1.0000 | ORAL_CAPSULE | Freq: Every day | ORAL | Status: DC

## 2022-01-20 MED ORDER — LACTATED RINGERS IV SOLN: INTRAVENOUS | Status: DC

## 2022-01-20 MED ORDER — ACETAMINOPHEN 500 MG PO TABS
ORAL_TABLET | ORAL | Status: AC
  Administered 2022-01-20: 1000 mg via ORAL
  Filled 2022-01-20: qty 2

## 2022-01-20 MED ORDER — CEFAZOLIN SODIUM-DEXTROSE 2-4 GM/100ML-% IV SOLN
2.0000 g | INTRAVENOUS | Status: AC
  Administered 2022-01-20: 2 g via INTRAVENOUS

## 2022-01-20 MED ORDER — ACETAMINOPHEN 10 MG/ML IV SOLN
1000.0000 mg | Freq: Once | INTRAVENOUS | Status: DC | PRN
Start: 2022-01-20 — End: 2022-01-20

## 2022-01-20 MED ORDER — DROPERIDOL 2.5 MG/ML IJ SOLN: 0.6250 mg | Freq: Once | INTRAMUSCULAR | Status: DC | PRN

## 2022-01-20 MED ORDER — EPHEDRINE SULFATE (PRESSORS) 50 MG/ML IJ SOLN
INTRAMUSCULAR | Status: DC | PRN
  Administered 2022-01-20 (×3): 5 mg via INTRAVENOUS

## 2022-01-20 MED ORDER — STERILE WATER FOR IRRIGATION IR SOLN
Status: DC | PRN
  Administered 2022-01-20: 1000 mL

## 2022-01-20 MED ORDER — OXYCODONE HCL 5 MG/5ML PO SOLN: 5.0000 mg | Freq: Once | ORAL | Status: AC | PRN

## 2022-01-20 MED ORDER — KETAMINE HCL 50 MG/5ML IJ SOSY
PREFILLED_SYRINGE | INTRAMUSCULAR | Status: AC
Start: 2022-01-20 — End: ?
  Filled 2022-01-20: qty 5

## 2022-01-20 MED ORDER — CEFAZOLIN SODIUM-DEXTROSE 1-4 GM/50ML-% IV SOLN
1.0000 g | Freq: Four times a day (QID) | INTRAVENOUS | Status: AC
  Administered 2022-01-20 – 2022-01-21 (×2): 1 g via INTRAVENOUS
  Filled 2022-01-20 (×3): qty 50

## 2022-01-20 MED ORDER — POVIDONE-IODINE 10 % EX SWAB
2.0000 | Freq: Once | CUTANEOUS | Status: AC
  Administered 2022-01-20: 2 via TOPICAL

## 2022-01-20 MED ORDER — SURGIPHOR WOUND IRRIGATION SYSTEM - OPTIME
TOPICAL | Status: DC | PRN
  Administered 2022-01-20: 450 mL via TOPICAL

## 2022-01-20 MED ORDER — TRANEXAMIC ACID-NACL 1000-0.7 MG/100ML-% IV SOLN
INTRAVENOUS | Status: AC
  Filled 2022-01-20: qty 100

## 2022-01-20 MED ORDER — 0.9 % SODIUM CHLORIDE (POUR BTL) OPTIME
TOPICAL | Status: DC | PRN
  Administered 2022-01-20: 500 mL

## 2022-01-20 MED ORDER — FENTANYL CITRATE (PF) 100 MCG/2ML IJ SOLN
INTRAMUSCULAR | Status: AC
  Filled 2022-01-20: qty 2

## 2022-01-20 MED ORDER — KETOROLAC TROMETHAMINE 15 MG/ML IJ SOLN
7.5000 mg | Freq: Four times a day (QID) | INTRAMUSCULAR | Status: DC
  Administered 2022-01-20 – 2022-01-21 (×3): 7.5 mg via INTRAVENOUS
  Filled 2022-01-20 (×2): qty 1

## 2022-01-20 MED ORDER — ONDANSETRON HCL 4 MG/2ML IJ SOLN: 4.0000 mg | Freq: Four times a day (QID) | INTRAMUSCULAR | Status: DC | PRN

## 2022-01-20 MED ORDER — GLYCOPYRROLATE 0.2 MG/ML IJ SOLN
INTRAMUSCULAR | Status: AC
  Filled 2022-01-20: qty 1

## 2022-01-20 MED ORDER — KETOROLAC TROMETHAMINE 15 MG/ML IJ SOLN
INTRAMUSCULAR | Status: AC
  Filled 2022-01-20: qty 1

## 2022-01-20 SURGICAL SUPPLY — 57 items
BLADE SAGITTAL WIDE XTHICK NO (BLADE) ×2 IMPLANT
BNDG COHESIVE 4X5 TAN ST LF (GAUZE/BANDAGES/DRESSINGS) ×4 IMPLANT
BRUSH SCRUB EZ  4% CHG (MISCELLANEOUS) ×1
BRUSH SCRUB EZ 4% CHG (MISCELLANEOUS) ×1 IMPLANT
CHLORAPREP W/TINT 26 (MISCELLANEOUS) ×2 IMPLANT
COVER HOLE (Hips) ×1 IMPLANT
CUP R3 52MM (Hips) ×1 IMPLANT
DRAPE 3/4 80X56 (DRAPES) ×2 IMPLANT
DRAPE C-ARM 42X72 X-RAY (DRAPES) ×2 IMPLANT
DRAPE STERI IOBAN 125X83 (DRAPES) IMPLANT
DRAPE U-SHAPE 47X51 STRL (DRAPES) ×2 IMPLANT
DRSG AQUACEL AG ADV 3.5X10 (GAUZE/BANDAGES/DRESSINGS) ×1 IMPLANT
DRSG AQUACEL AG ADV 3.5X14 (GAUZE/BANDAGES/DRESSINGS) IMPLANT
ELECT REM PT RETURN 9FT ADLT (ELECTROSURGICAL) ×2
ELECTRODE REM PT RTRN 9FT ADLT (ELECTROSURGICAL) ×1 IMPLANT
GAUZE 4X4 16PLY ~~LOC~~+RFID DBL (SPONGE) ×2 IMPLANT
GAUZE XEROFORM 1X8 LF (GAUZE/BANDAGES/DRESSINGS) IMPLANT
GLOVE BIO SURGEON STRL SZ8 (GLOVE) ×2 IMPLANT
GLOVE BIOGEL PI IND STRL 8.5 (GLOVE) ×2 IMPLANT
GLOVE BIOGEL PI INDICATOR 8.5 (GLOVE) ×2
GLOVE SURG ORTHO 8.5 STRL (GLOVE) ×2 IMPLANT
GOWN STRL REUS W/ TWL XL LVL3 (GOWN DISPOSABLE) ×2 IMPLANT
GOWN STRL REUS W/TWL XL LVL3 (GOWN DISPOSABLE) ×2
HEAD FEM KNEE TAPER 36MM XS-3 (Head) ×1 IMPLANT
HOLSTER ELECTROSUGICAL PENCIL (MISCELLANEOUS) ×2 IMPLANT
HOOD PEEL AWAY FLYTE STAYCOOL (MISCELLANEOUS) ×6 IMPLANT
IV NS 250ML (IV SOLUTION)
IV NS 250ML BAXH (IV SOLUTION) IMPLANT
IV NS IRRIG 3000ML ARTHROMATIC (IV SOLUTION) ×2 IMPLANT
KIT PATIENT CARE HANA TABLE (KITS) ×2 IMPLANT
KIT TURNOVER CYSTO (KITS) ×2 IMPLANT
LINER ACETAB 0 DEG (Liner) ×1 IMPLANT
MANIFOLD NEPTUNE II (INSTRUMENTS) ×2 IMPLANT
MAT ABSORB  FLUID 56X50 GRAY (MISCELLANEOUS) ×1
MAT ABSORB FLUID 56X50 GRAY (MISCELLANEOUS) ×1 IMPLANT
NDL SAFETY ECLIPSE 18X1.5 (NEEDLE) IMPLANT
NDL SPNL 20GX3.5 QUINCKE YW (NEEDLE) ×1 IMPLANT
NEEDLE HYPO 18GX1.5 SHARP (NEEDLE)
NEEDLE HYPO 22GX1.5 SAFETY (NEEDLE) ×2 IMPLANT
NEEDLE SPNL 20GX3.5 QUINCKE YW (NEEDLE) ×2 IMPLANT
PACK HIP PROSTHESIS (MISCELLANEOUS) ×2 IMPLANT
PADDING CAST BLEND 4X4 NS (MISCELLANEOUS) ×4 IMPLANT
PILLOW ABDUCTION MEDIUM (MISCELLANEOUS) ×2 IMPLANT
PULSAVAC PLUS IRRIG FAN TIP (DISPOSABLE) ×2
SCREW 6.5X25MM (Screw) ×1 IMPLANT
SOLUTION IRRIG SURGIPHOR (IV SOLUTION) ×2 IMPLANT
SPONGE T-LAP 18X18 ~~LOC~~+RFID (SPONGE) ×8 IMPLANT
STAPLER SKIN PROX 35W (STAPLE) ×2 IMPLANT
STEM LATERAL COLLAR LAT1 12/14 (Stem) ×1 IMPLANT
SUT BONE WAX W31G (SUTURE) ×2 IMPLANT
SUT DVC 2 QUILL PDO  T11 36X36 (SUTURE) ×1
SUT DVC 2 QUILL PDO T11 36X36 (SUTURE) ×1 IMPLANT
SUT VIC AB 2-0 CT1 18 (SUTURE) ×2 IMPLANT
SYR 20ML LL LF (SYRINGE) ×2 IMPLANT
TIP FAN IRRIG PULSAVAC PLUS (DISPOSABLE) ×1 IMPLANT
WAND WEREWOLF FASTSEAL 6.0 (MISCELLANEOUS) ×2 IMPLANT
WATER STERILE IRR 500ML POUR (IV SOLUTION) ×2 IMPLANT

## 2022-01-20 NOTE — Transfer of Care (Signed)
Immediate Anesthesia Transfer of Care Note  Patient: Shannon [name]  Procedure(s) Performed: TOTAL HIP ARTHROPLASTY ANTERIOR APPROACH (Right: Hip)  Patient Location: PACU  Anesthesia Type:Spinal  Level of Consciousness: awake, alert  and oriented  Airway & Oxygen Therapy: Patient Spontanous Breathing  Post-op Assessment: Report given to RN and Post -op Vital signs reviewed and stable  Post vital signs: Reviewed and stable  Last Vitals:  Vitals Value Taken Time  BP 111/44 01/20/22 1155  Temp 36.4 C 01/20/22 1155  Pulse 74 01/20/22 1155  Resp 20 01/20/22 1155  SpO2 96 % 01/20/22 1155  Vitals shown include unvalidated device data.  Last Pain:  Vitals:   01/20/22 0839  TempSrc: Oral  PainSc: 2          Complications: No notable events documented.

## 2022-01-20 NOTE — Anesthesia Procedure Notes (Signed)
Spinal  Patient location during procedure: OR Reason for block: surgical anesthesia Staffing Performed by: Demetrius Charity, CRNA Authorized by: Iran Ouch, MD   Preanesthetic Checklist Completed: patient identified, IV checked, site marked, risks and benefits discussed, surgical consent, monitors and equipment checked, pre-op evaluation and timeout performed Spinal Block Patient position: sitting Prep: Betadine Patient monitoring: heart rate, continuous pulse ox, blood pressure and cardiac monitor Approach: midline Location: L4-5 Injection technique: single-shot Needle Needle type: Whitacre and Introducer  Needle gauge: 24 G Needle length: 9 cm Assessment Events: CSF return Additional Notes Negative paresthesia. Negative blood return. Positive free-flowing CSF. Expiration date of kit checked and confirmed. Patient tolerated procedure well, without complications.

## 2022-01-20 NOTE — Op Note (Signed)
01/20/2022  11:42 AM  PATIENT:  Shannon [name]   MRN: [id number]  PRE-OPERATIVE DIAGNOSIS:  Osteoarthritis right  hip   POST-OPERATIVE DIAGNOSIS: Same  Procedure: Right Total Hip Replacement  Surgeon: Dola Argyle. Odis Luster, MD   Assist: Altamese Cabal, PA-C  Anesthesia: Spinal   EBL: 100 mL   Specimens: None   Drains: None   Components used: A size 1 lateral Polarstem Smith and Nephew, R3 size 52 mm shell, and a 36 mm -3 mm head    Description of the procedure in detail: After informed consent was obtained and the appropriate extremity marked in the pre-operative holding area, the patient was taken to the operating room and placed in the supine position on the fracture table. All pressure points were well padded and bilateral lower extremities were place in traction spars. The hip was prepped and draped in standard sterile fashion. A spinal anesthetic had been delivered by the anesthesia team. The skin and subcutaneous tissues were injected with a mixture of Marcaine with epinephrine for post-operative pain. A longitudinal incision approximately 10 cm in length was carried out from the anterior superior iliac spine to the greater trochanter. The tensor fascia was divided and blunt dissection was taken down to the level of the joint capsule. The lateral circumflex vessels were cauterized. Deep retractors were placed and a portion of the anterior capsule was excised. Using fluoroscopy the neck cut was planned and carried out with a sagittal saw. The head was passed from the field with use of a corkscrew and hip skid. Deep retractors were placed along the acetabulum and the degenerative labrum and large osteophytes were removed with a Rongeur. The cup was sequentially reamed to a size 52 mm. The wound was irrigated and using fluoroscopy the size 52 mm cup was impacted in to anatomic position. A single screw was placed followed by a threaded hole cover. The final liner was impacted in to position.  Attention was then turned to the proximal femur. The leg was placed in extension and external rotation. The canal was opened and sequentially broached to a size 1 lateral. The trial components were placed and the hip relocated. The components were found to be in good position using fluoroscopy. The hip was dislocated and the trial components removed. The final components were impacted in to position and the hip relocated. The final components were again check with fluoroscopy and found to be in good position. Hemostasis was achieved with electrocautery. The deep capsule was injected with Marcaine and epinephrine. The wound was irrigated with bacitracin laced normal saline and the tensor fascia closed with #2 Quill suture. The subcutaneous tissues were closed with 2-0 vicryl and staples for the skin. A sterile dressing was applied and an abduction pillow. Patient tolerated the procedure well and there were no apparent complication. Patient was taken to the recovery room in good condition.   Cassell Smiles, MD

## 2022-01-20 NOTE — Anesthesia Procedure Notes (Signed)
Spinal  Patient location during procedure: OR Start time: 01/20/2022 10:10 AM End time: 01/20/2022 10:15 AM Reason for block: surgical anesthesia Staffing Performed: resident/CRNA  Resident/CRNA: Demetrius Charity, CRNA Performed by: Demetrius Charity, CRNA Authorized by: Iran Ouch, MD   Preanesthetic Checklist Completed: patient identified, IV checked, site marked, risks and benefits discussed, surgical consent, monitors and equipment checked, pre-op evaluation and timeout performed Spinal Block Patient position: sitting Prep: ChloraPrep Patient monitoring: heart rate, continuous pulse ox, blood pressure and cardiac monitor Approach: midline Location: L3-4 Injection technique: single-shot Needle Needle type: Whitacre and Introducer  Needle gauge: 25 G Needle length: 9 cm Assessment Sensory level: T10 Events: CSF return Additional Notes Negative paresthesia. Negative blood return. Positive free-flowing CSF. Expiration date of kit checked and confirmed. Patient tolerated procedure well, without complications.

## 2022-01-20 NOTE — Evaluation (Signed)
Physical Therapy Evaluation Patient Details Name: ARHIANNA EBEY MRN: [id number] DOB: 05-29-1941 Today's Date: 01/20/2022  History of Present Illness  81 y/o female s/p R THA. PMH includes L THA (posterior approach 2020), R TKA (2021), anxiety, depression, cataracts and sleep apnea.   Clinical Impression  Pt in bed, agreeable to therapy. Daughter in room is very supportive. Pt lives alone in [place]; her daughter will be staying with her upon d/c. No steps to navigate. Pt is independent at baseline and is active. She performed all mobility with CGA, primarily for safety. She was able to the progress to SUP near end of session. Pt ambulated to the restroom and back to the recliner (RN aware of void). Once seated for ~10 minutes, pt was ready to return to bed due to discomfort in hip. PT assisted with transfer back to bed. She performed seated therex from THA packet. Will need to review supine exercises. Recommending HHPT at d/c. Would benefit from skilled PT to address above deficits and promote optimal return to PLOF.      Recommendations for follow up therapy are one component of a multi-disciplinary discharge planning process, led by the attending physician.  Recommendations may be updated based on patient status, additional functional criteria and insurance authorization.  Follow Up Recommendations Home health PT      Assistance Recommended at Discharge Intermittent Supervision/Assistance  Patient can return home with the following  A little help with walking and/or transfers;A little help with bathing/dressing/bathroom;Assistance with cooking/housework;Assist for transportation    Equipment Recommendations None recommended by PT  Recommendations for Other Services       Functional Status Assessment Patient has had a recent decline in their functional status and demonstrates the ability to make significant improvements in function in a reasonable and predictable amount of  time.     Precautions / Restrictions Precautions Precautions: None;Anterior Hip Restrictions Weight Bearing Restrictions: Yes RLE Weight Bearing: Weight bearing as tolerated      Mobility  Bed Mobility Overal bed mobility: Needs Assistance Bed Mobility: Supine to Sit, Sit to Supine     Supine to sit: Min guard, HOB elevated Sit to supine: Min guard   General bed mobility comments: Pt able to manage RLE using UE support    Transfers Overall transfer level: Needs assistance Equipment used: Rolling walker (2 wheels) Transfers: Sit to/from Stand, Bed to chair/wheelchair/BSC Sit to Stand: Min guard Stand pivot transfers: Min guard         General transfer comment: CGA for safety, progressing to SUP. VC to remind hand placement    Ambulation/Gait Ambulation/Gait assistance: Min guard Gait Distance (Feet): 15 Feet Assistive device: Rolling walker (2 wheels) Gait Pattern/deviations: Step-to pattern, Decreased stance time - right Gait velocity: decreased     General Gait Details: 47ft x2 reps. CGA for safety, no steadying required.  Stairs            Wheelchair Mobility    Modified Rankin (Stroke Patients Only)       Balance Overall balance assessment: Needs assistance Sitting-balance support: Feet supported Sitting balance-Leahy Scale: Good     Standing balance support: Bilateral upper extremity supported Standing balance-Leahy Scale: Fair Standing balance comment: using RW                             Pertinent Vitals/Pain Pain Assessment Pain Assessment: Faces Faces Pain Scale: Hurts little more Pain Location: right hip Pain Descriptors / Indicators:  Aching Pain Intervention(s): Limited activity within patient's tolerance, Monitored during session, Repositioned    Home Living Family/patient expects to be discharged to:: Private residence Living Arrangements: Alone Available Help at Discharge: Family Type of Home: Apartment Home  Access: Level entry       Home Layout: One level Home Equipment: Agricultural consultant (2 wheels);BSC/3in1;Shower seat      Prior Function Prior Level of Function : Independent/Modified Independent;Driving             Mobility Comments: Does not use AD at baseline; very active with gardening, walking her dog, etc. Denies falls. ADLs Comments: Independent     Hand Dominance        Extremity/Trunk Assessment   Upper Extremity Assessment Upper Extremity Assessment: Overall WFL for tasks assessed (pain in bilateral wrists (baseline))    Lower Extremity Assessment Lower Extremity Assessment: RLE deficits/detail RLE Deficits / Details: RLE s/p THA; LLE WFL. Mild sensation changes in left foot.       Communication   Communication: No difficulties  Cognition Arousal/Alertness: Awake/alert Behavior During Therapy: WFL for tasks assessed/performed Overall Cognitive Status: Within Functional Limits for tasks assessed                                          General Comments      Exercises Total Joint Exercises Ankle Circles/Pumps: AROM, Right, 10 reps, Seated Long Arc Quad: AROM, Right, 10 reps, Seated   Assessment/Plan    PT Assessment Patient needs continued PT services  PT Problem List Decreased strength;Decreased range of motion;Decreased activity tolerance;Decreased balance;Decreased mobility       PT Treatment Interventions Balance training;Gait training;Neuromuscular re-education;Functional mobility training;Patient/family education;Therapeutic activities;Therapeutic exercise    PT Goals (Current goals can be found in the Care Plan section)  Acute Rehab PT Goals Patient Stated Goal: to go home PT Goal Formulation: With patient Time For Goal Achievement: 02/03/22 Potential to Achieve Goals: Good    Frequency BID     Co-evaluation               AM-PAC PT "6 Clicks" Mobility  Outcome Measure Help needed turning from your back to your  side while in a flat bed without using bedrails?: None Help needed moving from lying on your back to sitting on the side of a flat bed without using bedrails?: None Help needed moving to and from a bed to a chair (including a wheelchair)?: A Little Help needed standing up from a chair using your arms (e.g., wheelchair or bedside chair)?: A Little Help needed to walk in hospital room?: A Little Help needed climbing 3-5 steps with a railing? : A Little 6 Click Score: 20    End of Session Equipment Utilized During Treatment: Gait belt Activity Tolerance: Patient tolerated treatment well Patient left: in bed;with family/visitor present Nurse Communication: Mobility status PT Visit Diagnosis: Muscle weakness (generalized) (M62.81)    Time: 1455-1540 PT Time Calculation (min) (ACUTE ONLY): 45 min   Charges:   PT Evaluation $PT Eval Low Complexity: 1 Low PT Treatments $Therapeutic Activity: 8-22 mins        Basilia Jumbo PT, DPT

## 2022-01-20 NOTE — H&P (Signed)
The patient has been re-examined, and the chart reviewed, and there have been no interval changes to the documented history and physical.  Plan a right total hip today.  Anesthesia is not consulted regarding a peripheral nerve block for post-operative pain.  The risks, benefits, and alternatives have been discussed at length, and the patient is willing to proceed.    

## 2022-01-21 DIAGNOSIS — M1611 Unilateral primary osteoarthritis, right hip: Secondary | ICD-10-CM | POA: Diagnosis not present

## 2022-01-21 MED ORDER — HYDROCODONE-ACETAMINOPHEN 5-325 MG PO TABS: 1.0000 | ORAL_TABLET | ORAL | 0 refills | Status: DC | PRN

## 2022-01-21 MED ORDER — DOCUSATE SODIUM 100 MG PO CAPS: 100.0000 mg | ORAL_CAPSULE | Freq: Two times a day (BID) | ORAL | 0 refills | Status: DC

## 2022-01-21 MED ORDER — METHOCARBAMOL 750 MG PO TABS: 750.0000 mg | ORAL_TABLET | Freq: Three times a day (TID) | ORAL | 1 refills | Status: DC | PRN

## 2022-01-21 MED ORDER — ASPIRIN 81 MG PO CHEW: 81.0000 mg | CHEWABLE_TABLET | Freq: Two times a day (BID) | ORAL | 0 refills | Status: DC

## 2022-01-21 NOTE — Progress Notes (Signed)
  The patient lives at Home alone, in [place]; her daughter will be staying with her upon d/c The patient  currently has DME including a rolling walker and 3 in 1 The patient will need no additional DME They have transportation with her daughter They can afford their medication  They are set up with [facility]  for Home health services

## 2022-01-21 NOTE — Plan of Care (Signed)

## 2022-01-21 NOTE — Discharge Instructions (Signed)

## 2022-01-21 NOTE — Anesthesia Postprocedure Evaluation (Signed)
Anesthesia Post Note  Patient: ANESSIA OAKLAND  Procedure(s) Performed: TOTAL HIP ARTHROPLASTY ANTERIOR APPROACH (Right: Hip)  Patient location during evaluation: Nursing Unit Anesthesia Type: Spinal Level of consciousness: oriented and awake and alert Pain management: pain level controlled Vital Signs Assessment: post-procedure vital signs reviewed and stable Respiratory status: spontaneous breathing and respiratory function stable Cardiovascular status: blood pressure returned to baseline and stable Postop Assessment: no headache, no backache, no apparent nausea or vomiting, patient able to bend at knees, spinal receding, adequate PO intake and able to ambulate Anesthetic complications: no   No notable events documented.   Last Vitals:  Vitals:   01/20/22 2013 01/21/22 0512  BP: (!) 142/57 (!) 131/47  Pulse: (!) 55 (!) 45  Resp: 17 17  Temp: 36.4 C (!) 36.3 C  SpO2: 95% 95%    Last Pain:  Vitals:   01/21/22 0654  TempSrc:   PainSc: 4                  Jules Schick

## 2022-01-21 NOTE — Progress Notes (Signed)
  Subjective:  Patient reports pain as mild to moderate.    Objective:   VITALS:   Vitals:   01/20/22 1717 01/20/22 1719 01/20/22 2013 01/21/22 0512  BP: (!) 129/43 (!) 141/55 (!) 142/57 (!) 131/47  Pulse: 71  (!) 55 (!) 45  Resp: _0 Temp: 97.8 F (36.6 C)  97.6 F (36.4 C) (!) 97.4 F (36.3 C)  TempSrc:      SpO2: 95%  95% 95%  Weight:      Height:        PHYSICAL EXAM:  Neurologically intact ABD soft Neurovascular intact Sensation intact distally Intact pulses distally Dorsiflexion/Plantar flexion intact Incision: scant drainage No cellulitis present Compartment soft  LABS  Results for orders placed or performed during the hospital encounter of 01/20/22 (from the past 24 hour(s))  ABO/Rh     Status: None   Collection Time: 01/20/22  9:08 AM  Result Value Ref Range   ABO/RH(D)      A POS Performed at [facility], [address], [place], [place] [address]     DG HIP UNILAT WITH PELVIS 1V RIGHT  Result Date: 01/20/2022 CLINICAL DATA:  81 year old female undergoing right total hip arthroplasty. EXAM: DG HIP (WITH OR WITHOUT PELVIS) 1V RIGHT COMPARISON:  None Available. FINDINGS: Fluoroscopic images demonstrate intraoperative changes after right total hip arthroplasty placement. The femoral and acetabular components are well seated without evidence of complication. IMPRESSION: Intraoperative radiograph demonstrating right total hip arthroplasty. Electronically Signed   By: Ruthann Cancer M.D.   On: 01/20/2022 12:00   DG C-Arm 1-60 Min-No Report  Result Date: 01/20/2022 Fluoroscopy was utilized by the requesting physician.  No radiographic interpretation.    Assessment/Plan: 1 Day Post-Op   Principal Problem:   History of total hip replacement, right   Advance diet Up with therapy Discharge home today if PT goals met   Carlynn Spry , PA-C 01/21/2022, 6:33 AM

## 2022-01-21 NOTE — Progress Notes (Signed)
Physical Therapy Treatment Patient Details Name: Shannon [name] MRN: [id number] DOB: 1940/12/02 Today's Date: 01/21/2022   History of Present Illness 81 y/o female s/p R THA. PMH includes L THA (posterior approach 2020), R TKA (2021), anxiety, depression, cataracts and sleep apnea.    PT Comments    Pt in bed, daughter in room, agreeable to therapy. She has progressed to SUP with all mobility. Sit>sup most challenging; pt performed with UE's to support RLE. PT ambulated 375f with step-through pattern. Antalgic pattern increased during final 534f Pt has met all PT goals; appropriate for d/c from PT stand point. D/c recs remain appropriate. No further PT needs in the acute setting.   Recommendations for follow up therapy are one component of a multi-disciplinary discharge planning process, led by the attending physician.  Recommendations may be updated based on patient status, additional functional criteria and insurance authorization.  Follow Up Recommendations  Home health PT     Assistance Recommended at Discharge Intermittent Supervision/Assistance  Patient can return home with the following A little help with walking and/or transfers;A little help with bathing/dressing/bathroom;Assistance with cooking/housework;Assist for transportation   Equipment Recommendations  None recommended by PT    Recommendations for Other Services       Precautions / Restrictions Restrictions Weight Bearing Restrictions: Yes RLE Weight Bearing: Weight bearing as tolerated     Mobility  Bed Mobility Overal bed mobility: Needs Assistance Bed Mobility: Supine to Sit, Sit to Supine     Supine to sit: Supervision Sit to supine: Supervision   General bed mobility comments: Pt manages RLE with UE support    Transfers Overall transfer level: Needs assistance Equipment used: Rolling walker (2 wheels) Transfers: Sit to/from Stand, Bed to chair/wheelchair/BSC Sit to Stand: Supervision Stand  pivot transfers: Supervision         General transfer comment: SUP; no VC required for technique    Ambulation/Gait Ambulation/Gait assistance: Supervision Gait Distance (Feet): 380 Feet Assistive device: Rolling walker (2 wheels) Gait Pattern/deviations: Step-through pattern, Antalgic       General Gait Details: 10 + [id number]; no safety concerns   Stairs             Wheelchair Mobility    Modified Rankin (Stroke Patients Only)       Balance Overall balance assessment: Needs assistance Sitting-balance support: Feet supported Sitting balance-Leahy Scale: Good     Standing balance support: Bilateral upper extremity supported Standing balance-Leahy Scale: Good Standing balance comment: using RW                            Cognition Arousal/Alertness: Awake/alert Behavior During Therapy: WFL for tasks assessed/performed Overall Cognitive Status: Within Functional Limits for tasks assessed                                          Exercises Total Joint Exercises Ankle Circles/Pumps: AROM, Right, 10 reps, Seated Quad Sets: AROM, Right, 10 reps, Supine Gluteal Sets: AROM, Right, Supine, 10 reps Towel Squeeze: AROM, Right, 10 reps, Supine Short Arc Quad: AROM, Right, 10 reps, Supine Heel Slides: AROM, Right, 10 reps, Supine Hip ABduction/ADduction: AROM, Right, 10 reps, Supine Straight Leg Raises: AAROM, Right, 10 reps, Supine Long Arc Quad: AROM, Right, 10 reps, Seated    General Comments        Pertinent Vitals/Pain Pain Assessment  Pain Assessment: 0-10 Pain Score: 7  Pain Location: right hip w/ WB Pain Descriptors / Indicators: Aching    Home Living Family/patient expects to be discharged to:: Private residence Living Arrangements: Alone                      Prior Function            PT Goals (current goals can now be found in the care plan section) Acute Rehab PT Goals Patient Stated Goal: to go  home PT Goal Formulation: With patient Time For Goal Achievement: 02/03/22 Potential to Achieve Goals: Good    Frequency    BID      PT Plan      Co-evaluation              AM-PAC PT "6 Clicks" Mobility   Outcome Measure  Help needed turning from your back to your side while in a flat bed without using bedrails?: None Help needed moving from lying on your back to sitting on the side of a flat bed without using bedrails?: None Help needed moving to and from a bed to a chair (including a wheelchair)?: None Help needed standing up from a chair using your arms (e.g., wheelchair or bedside chair)?: None Help needed to walk in hospital room?: A Little Help needed climbing 3-5 steps with a railing? : A Little 6 Click Score: 22    End of Session Equipment Utilized During Treatment: Gait belt Activity Tolerance: Patient tolerated treatment well Patient left: with family/visitor present;in chair Nurse Communication: Mobility status PT Visit Diagnosis: Muscle weakness (generalized) (M62.81)     Time: 1991-4445 PT Time Calculation (min) (ACUTE ONLY): 23 min  Charges:  $Therapeutic Exercise: 8-22 mins $Therapeutic Activity: 8-22 mins                     Patrina Levering PT, DPT

## 2022-01-21 NOTE — Discharge Summary (Signed)
Physician Discharge Summary  Patient ID: Shannon [name] MRN: [id number] DOB/AGE: Nov 07, 1940 81 y.o.  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, right  Discharge Diagnoses:  M16.11 Unilateral primary osteoarthritis, right hip Principal Problem:   History of total hip replacement, right   Past Medical History:  Diagnosis Date   Allergy    Anxiety    Arthritis    osteoarthritis   Cataract    surgery both eyes 04/22/2011 implants   Depression    Diverticulitis    Essential hypertension    GERD (gastroesophageal reflux disease)    Heart murmur    History of cataract    Hyperlipidemia    Osteoporosis    Situational mixed anxiety and depressive disorder    Sleep apnea    cpap use    Surgeries: Procedure(s): TOTAL HIP ARTHROPLASTY ANTERIOR APPROACH on 01/20/2022   Consultants (if any):   Discharged Condition: Improved  Hospital Course: Shannon [name] is an 81 y.o. 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    She was given perioperative antibiotics:  Anti-infectives (From admission, onward)    Start     Dose/Rate Route Frequency Ordered Stop   01/20/22 1330  ceFAZolin (ANCEF) IVPB 1 g/50 mL premix        1 g 100 mL/hr over 30 Minutes Intravenous Every 6 hours 01/20/22 1316 01/21/22 0032   01/20/22 1316  valACYclovir (VALTREX) tablet 1,000 mg  Status:  Discontinued        1,000 mg Oral 2 times daily PRN 01/20/22 1316 01/20/22 1322   01/20/22 0821  ceFAZolin (ANCEF) 2-4 GM/100ML-% IVPB       Note to Pharmacy: Brain Hilts F: cabinet override      01/20/22 0821 01/20/22 1025   01/20/22 0600  ceFAZolin (ANCEF) IVPB 2g/100 mL premix        2 g 200 mL/hr over 30 Minutes  Intravenous On call to O.R. 01/20/22 0224 01/20/22 1025     .  She was given sequential compression devices, early ambulation, and aspirin 81 mg twice a day for 30 days for DVT prophylaxis.  She benefited maximally from the hospital stay and there were no complications.    Recent vital signs:  Vitals:   01/20/22 2013 01/21/22 0512  BP: (!) 142/57 (!) 131/47  Pulse: (!) 55 (!) 45  Resp: 17 17  Temp: 97.6 F (36.4 C) (!) 97.4 F (36.3 C)  SpO2: 95% 95%    Recent laboratory studies:  Lab Results  Component Value Date   HGB 14.3 01/15/2022   Lab Results  Component Value Date   WBC 6.9 01/15/2022   PLT 312 01/15/2022   No results found for: "INR" Lab Results  Component Value Date   NA 140 01/15/2022   K 3.5 01/15/2022   CL 104 01/15/2022   CO2 27 01/15/2022   BUN 12 01/15/2022   CREATININE 0.69 01/15/2022   GLUCOSE 102 (H) 01/15/2022    Discharge Medications:   Allergies as of 01/21/2022       Reactions   Augmentin [amoxicillin-pot Clavulanate]    Calcium-containing Compounds Nausea Only   Ciprofloxacin Nausea Only   Clams [shellfish Allergy] Diarrhea, Nausea Only   Contrast Media [iodinated Contrast Media]  Flagyl [metronidazole]    Statins    Tape    Adhesive tape-silicones        Medication List     TAKE these medications    aspirin 81 MG chewable tablet Chew 1 tablet (81 mg total) by mouth 2 (two) times daily.   [employer] B12 GUMMIES PO Take 1,000 mcg by mouth daily.   docusate sodium 100 MG capsule Commonly known as: COLACE Take 1 capsule (100 mg total) by mouth 2 (two) times daily.   fluticasone 50 MCG/ACT nasal spray Commonly known as: FLONASE Place 1 spray into both nostrils at bedtime as needed for allergies.   hydrochlorothiazide 12.5 MG tablet Commonly known as: HYDRODIURIL Take 12.5 mg by mouth daily.   HYDROcodone-acetaminophen 5-325 MG tablet Commonly known as: NORCO/VICODIN Take 1 tablet by mouth every 4 (four) hours as needed  for moderate pain (pain score 4-6).   hydrocortisone 2.5 % cream Apply 1 Application topically 2 (two) times daily as needed (prn).   hydrocortisone 25 MG suppository Commonly known as: ANUSOL-HC Place 25 mg rectally 2 (two) times daily as needed for hemorrhoids.   ketotifen 0.025 % ophthalmic solution Commonly known as: ZADITOR Place 1 drop into both eyes as needed (prn).   losartan 100 MG tablet Commonly known as: COZAAR Take 100 mg by mouth daily.   meloxicam 15 MG tablet Commonly known as: MOBIC Take 15 mg by mouth daily as needed for pain.   methocarbamol 750 MG tablet Commonly known as: Robaxin-750 Take 1 tablet (750 mg total) by mouth every 8 (eight) hours as needed for muscle spasms.   montelukast 10 MG tablet Commonly known as: SINGULAIR Take 10 mg by mouth at bedtime.   pantoprazole 40 MG tablet Commonly known as: PROTONIX Take 40 mg by mouth every other day.   PRENATAL GUMMIES PO Take 1 tablet by mouth daily.   PRENATAL ONE DAILY PO Take by mouth.   Prevagen Extra Strength 20 MG Caps Generic drug: Apoaequorin Take 1 capsule by mouth daily.   TYLENOL ARTHRITIS PAIN PO Take 1 tablet by mouth as needed (arthritis pain).   valACYclovir 1000 MG tablet Commonly known as: VALTREX Take 1,000 mg by mouth 2 (two) times daily as needed (prn).               Durable Medical Equipment  (From admission, onward)           Start     Ordered   01/21/22 0637  For home use only DME 3 n 1  Once        01/21/22 1610   01/21/22 0637  For home use only DME Walker rolling  Once       Question Answer Comment  Walker: With 5 Inch Wheels   Patient needs a walker to treat with the following condition Osteoarthritis of right hip      01/21/22 9604   01/20/22 1317  DME Walker rolling  Once       Question:  Patient needs a walker to treat with the following condition  Answer:  History of total hip replacement, right   01/20/22 1316   01/20/22 1317  DME 3 n 1   Once        01/20/22 1316   01/20/22 1317  DME Bedside commode  Once       Question:  Patient needs a bedside commode to treat with the following condition  Answer:  History of total hip replacement, right   01/20/22  1316            Diagnostic Studies: DG HIP UNILAT WITH PELVIS 1V RIGHT  Result Date: 01/20/2022 CLINICAL DATA:  81 year old female undergoing right total hip arthroplasty. EXAM: DG HIP (WITH OR WITHOUT PELVIS) 1V RIGHT COMPARISON:  None Available. FINDINGS: Fluoroscopic images demonstrate intraoperative changes after right total hip arthroplasty placement. The femoral and acetabular components are well seated without evidence of complication. IMPRESSION: Intraoperative radiograph demonstrating right total hip arthroplasty. Electronically Signed   By: Marliss Coots M.D.   On: 01/20/2022 12:00   DG C-Arm 1-60 Min-No Report  Result Date: 01/20/2022 Fluoroscopy was utilized by the requesting physician.  No radiographic interpretation.    Disposition: Discharge disposition: 01-Home or Self Care            Signed: Altamese Cabal ,PA-C 01/21/2022, 6:37 AM

## 2022-01-22 LAB — SURGICAL PATHOLOGY

## 2022-02-10 NOTE — H&P (Signed)
NAME: Shannon [name] MRN:   [id number] DOB:   [birth year]     HISTORY AND PHYSICAL    CHIEF COMPLAINT:  right hip pain   HISTORY:   Shannon [name] a 81 y.o. female  with right  Hip Pain Patient complains of right hip pain. Onset of the symptoms was several years ago. Inciting event: known DJD. The patient reports the hip pain is worse with weight bearing. Associated symptoms: none. Aggravating symptoms include: any weight bearing. Patient has had prior hip problems. Previous visits for this problem: multiple, this is a longstanding diagnosis. Last seen several weeks ago by me. Evaluation to date: plain films, which were abnormal  osteoarthritis . Treatment to date: OTC analgesics, which have been somewhat effective, prescription analgesics, which have been somewhat effective, and home exercise program, which has been somewhat effective.      Plan for right total hip replacement   PAST MEDICAL HISTORY:       Past Medical History:  Diagnosis Date   Allergy     Anxiety     Arthritis      osteoarthritis   Cataract      surgery both eyes 04/22/2011 implants   Depression     Diverticulitis     GERD (gastroesophageal reflux disease)     Heart murmur     History of cataract     Hyperlipidemia     Hypertension     Osteoporosis     Situational mixed anxiety and depressive disorder     Sleep apnea        PAST SURGICAL HISTORY:        Past Surgical History:  Procedure Laterality Date   ABDOMINAL HYSTERECTOMY       APPENDECTOMY       BLEPHAROPLASTY   2016   CATARACT EXTRACTION       CHOLECYSTECTOMY       COLONOSCOPY       COLONOSCOPY WITH PROPOFOL N/A 09/17/2020    Procedure: COLONOSCOPY WITH PROPOFOL;  Surgeon: Toledo, Boykin Nearing, MD;  Location: [facility] ENDOSCOPY;  Service: Gastroenterology;  Laterality: N/A;   HERNIA REPAIR       HIP ARTHROPLASTY Bilateral     JOINT REPLACEMENT        left hip replacement, right knee replacement   KNEE ARTHROSCOPY       LAPAROSCOPIC COLON RESECTION        OOPHORECTOMY       vein ligations          MEDICATIONS:  (Not in a hospital admission)     ALLERGIES:        Allergies  Allergen Reactions   Augmentin [Amoxicillin-Pot Clavulanate]     Calcium-Containing Compounds     Ciprofloxacin     Clams [Shellfish Allergy]     Contrast Media [Iodinated Contrast Media]     Flagyl [Metronidazole]     Statins     Tape        Adhesive tape-silicones      REVIEW OF SYSTEMS:   Negative except HPI   FAMILY HISTORY:        Family History  Problem Relation Age of Onset   Cancer Father     Osteoarthritis Father     Ulcers Father     Cancer Sister          liver, lungs   Arthritis Sister          rheumatoid   Hypertension Sister     Hyperlipidemia  Sister     Diabetes Sister     GI Disease Sister     Stroke Sister     Liver disease Sister     Arthritis Sister     Diabetes Brother     Osteoarthritis Brother     Skin cancer Brother     Cancer Brother          pancreatic, kidney   Breast cancer Niece          late 30's   Breast cancer Niece        SOCIAL HISTORY:   reports that she quit smoking about 53 years ago. Her smoking use included cigarettes. She has a 3.75 pack-year smoking history. She has never used smokeless tobacco. She reports that she does not drink alcohol and does not use drugs.   PHYSICAL EXAM:  General appearance: alert, cooperative, and no distress Neck: no JVD and supple, symmetrical, trachea midline Resp: clear to auscultation bilaterally Cardio: regular rate and rhythm, S1, S2 normal, no murmur, click, rub or gallop GI: soft, non-tender; bowel sounds normal; no masses,  no organomegaly Extremities: extremities normal, atraumatic, no cyanosis or edema and Homans sign is negative, no sign of DVT Pulses: 2+ and symmetric Skin: Skin color, texture, turgor normal. No rashes or lesions Neurologic: Alert and oriented X 3, normal strength and tone. Normal symmetric reflexes. Normal coordination and gait       LABORATORY STUDIES: Recent Labs (last 2 labs)   No results for input(s): "WBC", "HGB", "HCT", "PLT" in the last 72 hours.                Recent Labs (last 2 labs)   No results for input(s): "NA", "K", "CL", "CO2", "GLUCOSE", "BUN", "CREATININE", "CALCIUM" in the last 72 hours.     STUDIES/RESULTS:              Imaging Results  No results found.     ASSESSMENT:  End stage osteoarthritis right hip                              Active Problems:   * No active hospital problems. *     PLAN:  Right Primary Total Hip      Altamese Cabal 02/10/2022. 11:34 AM

## 2022-03-31 ENCOUNTER — Emergency Department: Payer: Medicare Other

## 2022-03-31 ENCOUNTER — Inpatient Hospital Stay
Admission: EM | Admit: 2022-03-31 | Discharge: 2022-04-02 | DRG: 065 | Disposition: A | Discharge status: Home / Self Care | Payer: Medicare Other | Attending: Osteopathic Medicine | Admitting: Osteopathic Medicine

## 2022-03-31 DIAGNOSIS — Z90710 Acquired absence of both cervix and uterus: Secondary | ICD-10-CM

## 2022-03-31 DIAGNOSIS — Z8249 Family history of ischemic heart disease and other diseases of the circulatory system: Secondary | ICD-10-CM

## 2022-03-31 DIAGNOSIS — E782 Mixed hyperlipidemia: Secondary | ICD-10-CM

## 2022-03-31 DIAGNOSIS — I6381 Other cerebral infarction due to occlusion or stenosis of small artery: Principal | ICD-10-CM | POA: Diagnosis present

## 2022-03-31 DIAGNOSIS — K219 Gastro-esophageal reflux disease without esophagitis: Secondary | ICD-10-CM | POA: Diagnosis present

## 2022-03-31 DIAGNOSIS — E876 Hypokalemia: Secondary | ICD-10-CM | POA: Diagnosis present

## 2022-03-31 DIAGNOSIS — Z823 Family history of stroke: Secondary | ICD-10-CM

## 2022-03-31 DIAGNOSIS — R001 Bradycardia, unspecified: Secondary | ICD-10-CM | POA: Diagnosis not present

## 2022-03-31 DIAGNOSIS — I6523 Occlusion and stenosis of bilateral carotid arteries: Secondary | ICD-10-CM | POA: Diagnosis present

## 2022-03-31 DIAGNOSIS — Z90722 Acquired absence of ovaries, bilateral: Secondary | ICD-10-CM

## 2022-03-31 DIAGNOSIS — Z87891 Personal history of nicotine dependence: Secondary | ICD-10-CM

## 2022-03-31 DIAGNOSIS — I10 Essential (primary) hypertension: Secondary | ICD-10-CM | POA: Diagnosis present

## 2022-03-31 DIAGNOSIS — Z7982 Long term (current) use of aspirin: Secondary | ICD-10-CM

## 2022-03-31 DIAGNOSIS — R200 Anesthesia of skin: Principal | ICD-10-CM

## 2022-03-31 DIAGNOSIS — Z881 Allergy status to other antibiotic agents status: Secondary | ICD-10-CM

## 2022-03-31 DIAGNOSIS — R29701 NIHSS score 1: Secondary | ICD-10-CM | POA: Diagnosis present

## 2022-03-31 DIAGNOSIS — Z20822 Contact with and (suspected) exposure to covid-19: Secondary | ICD-10-CM | POA: Diagnosis present

## 2022-03-31 DIAGNOSIS — Z888 Allergy status to other drugs, medicaments and biological substances status: Secondary | ICD-10-CM

## 2022-03-31 DIAGNOSIS — Z91048 Other nonmedicinal substance allergy status: Secondary | ICD-10-CM

## 2022-03-31 DIAGNOSIS — E785 Hyperlipidemia, unspecified: Secondary | ICD-10-CM | POA: Diagnosis present

## 2022-03-31 DIAGNOSIS — Z83438 Family history of other disorder of lipoprotein metabolism and other lipidemia: Secondary | ICD-10-CM

## 2022-03-31 DIAGNOSIS — Z9079 Acquired absence of other genital organ(s): Secondary | ICD-10-CM

## 2022-03-31 DIAGNOSIS — Z96643 Presence of artificial hip joint, bilateral: Secondary | ICD-10-CM | POA: Diagnosis present

## 2022-03-31 DIAGNOSIS — Z9049 Acquired absence of other specified parts of digestive tract: Secondary | ICD-10-CM

## 2022-03-31 DIAGNOSIS — F32A Depression, unspecified: Secondary | ICD-10-CM | POA: Diagnosis present

## 2022-03-31 DIAGNOSIS — G459 Transient cerebral ischemic attack, unspecified: Secondary | ICD-10-CM | POA: Diagnosis not present

## 2022-03-31 DIAGNOSIS — I6521 Occlusion and stenosis of right carotid artery: Secondary | ICD-10-CM

## 2022-03-31 DIAGNOSIS — Z91013 Allergy to seafood: Secondary | ICD-10-CM

## 2022-03-31 DIAGNOSIS — G4733 Obstructive sleep apnea (adult) (pediatric): Secondary | ICD-10-CM | POA: Diagnosis present

## 2022-03-31 DIAGNOSIS — E538 Deficiency of other specified B group vitamins: Secondary | ICD-10-CM | POA: Diagnosis present

## 2022-03-31 DIAGNOSIS — M1990 Unspecified osteoarthritis, unspecified site: Secondary | ICD-10-CM | POA: Diagnosis present

## 2022-03-31 DIAGNOSIS — Z91041 Radiographic dye allergy status: Secondary | ICD-10-CM

## 2022-03-31 DIAGNOSIS — G8191 Hemiplegia, unspecified affecting right dominant side: Secondary | ICD-10-CM | POA: Diagnosis present

## 2022-03-31 DIAGNOSIS — F419 Anxiety disorder, unspecified: Secondary | ICD-10-CM | POA: Diagnosis present

## 2022-03-31 DIAGNOSIS — Z96651 Presence of right artificial knee joint: Secondary | ICD-10-CM | POA: Diagnosis present

## 2022-03-31 HISTORY — DX: Other cerebral infarction due to occlusion or stenosis of small artery: I63.81

## 2022-03-31 LAB — COMPREHENSIVE METABOLIC PANEL
ALT: 17 U/L (ref 0–44)
AST: 24 U/L (ref 15–41)
Albumin: 4 g/dL (ref 3.5–5.0)
Alkaline Phosphatase: 48 U/L (ref 38–126)
Anion gap: 8 (ref 5–15)
BUN: 11 mg/dL (ref 8–23)
CO2: 27 mmol/L (ref 22–32)
Calcium: 8.9 mg/dL (ref 8.9–10.3)
Chloride: 106 mmol/L (ref 98–111)
Creatinine, Ser: 0.64 mg/dL (ref 0.44–1.00)
GFR, Estimated: >60 mL/min (ref >60)
Glucose, Bld: 104 mg/dL — ABNORMAL HIGH (ref 70–99)
Potassium: 3.3 mmol/L — ABNORMAL LOW (ref 3.5–5.1)
Sodium: 141 mmol/L (ref 135–145)
Total Bilirubin: 1.1 mg/dL (ref 0.3–1.2)
Total Protein: 6.9 g/dL (ref 6.5–8.1)

## 2022-03-31 LAB — CBC
HCT: 37.3 % (ref 36.0–46.0)
Hemoglobin: 12.5 g/dL (ref 12.0–15.0)
MCH: 32.1 pg (ref 26.0–34.0)
MCHC: 33.5 g/dL (ref 30.0–36.0)
MCV: 95.6 fL (ref 80.0–100.0)
Platelets: 248 10*3/uL (ref 150–400)
RBC: 3.9 MIL/uL (ref 3.87–5.11)
RDW: 12.9 % (ref 11.5–15.5)
WBC: 6.5 10*3/uL (ref 4.0–10.5)
nRBC: 0 % (ref 0.0–0.2)

## 2022-03-31 LAB — PROTIME-INR
INR: 1.1 (ref 0.8–1.2)
Prothrombin Time: 13.7 seconds (ref 11.4–15.2)

## 2022-03-31 LAB — ETHANOL: Alcohol, Ethyl (B): <10 mg/dL (ref <10)

## 2022-03-31 LAB — DIFFERENTIAL
Abs Immature Granulocytes: 0.02 10*3/uL (ref 0.00–0.07)
Basophils Absolute: 0.1 10*3/uL (ref 0.0–0.1)
Basophils Relative: 1 %
Eosinophils Absolute: 0.1 10*3/uL (ref 0.0–0.5)
Eosinophils Relative: 2 %
Immature Granulocytes: 0 %
Lymphocytes Relative: 30 %
Lymphs Abs: 2 10*3/uL (ref 0.7–4.0)
Monocytes Absolute: 0.6 10*3/uL (ref 0.1–1.0)
Monocytes Relative: 9 %
Neutro Abs: 3.7 10*3/uL (ref 1.7–7.7)
Neutrophils Relative %: 58 %

## 2022-03-31 LAB — CBG MONITORING, ED: Glucose-Capillary: 98 mg/dL (ref 70–99)

## 2022-03-31 LAB — APTT: aPTT: 25 seconds (ref 24–36)

## 2022-03-31 MED ORDER — ACETAMINOPHEN 500 MG PO TABS
1000.0000 mg | ORAL_TABLET | Freq: Once | ORAL | Status: DC
  Filled 2022-03-31: qty 2

## 2022-03-31 NOTE — ED Notes (Signed)
Teleneuro activated.  

## 2022-03-31 NOTE — ED Notes (Signed)
Code Stroke called to [facility] spoke to [facility] @ 2323 who will initiate activation page

## 2022-03-31 NOTE — ED Notes (Signed)
Per neurologist, the mild facial droop will not count towards the NIHSS due to her face being symmetric when smiling.

## 2022-03-31 NOTE — ED Notes (Signed)
Patient transported to CT 

## 2022-03-31 NOTE — ED Notes (Signed)
EDP called to bedside

## 2022-03-31 NOTE — ED Triage Notes (Addendum)
PER EMS: pt had sudden onset of numbness to face and entire right side of body (arm and leg) at 2145. Mild left sided facial droop noticed.Pt speaking clear complete sentences. No weakness noted. She also reports a headache, GCS 15 BP-180/90, HR-85, 94% RA, CBG-112  Pt reports right hip surgery 02/20/22. Denies taking any blood thinners.

## 2022-03-31 NOTE — ED Provider Notes (Signed)
[facility] Provider Note    Event Date/Time   First MD Initiated Contact with Patient 03/31/22 2256     (approximate)   History   Code Stroke   HPI  Shannon [name] is a 81 y.o. female who presents to the ED for evaluation of Code Stroke   I reviewed clinic visit from 7/27.  History of HTN, HLD, GERD.   Patient presents to the ED for the ration of acute numbness and tingling on the right side of her body in the past 1 hour.  She reports symptoms started around 9:50 PM and have been persistent since that time.  No falls, injuries, syncope, recent illnesses, fevers.  She was report developing a mild headache on upon arrival to the ED in the past few minutes.   Physical Exam   Triage Vital Signs: ED Triage Vitals  Enc Vitals Group     BP 03/31/22 2301 (!) 163/99     Pulse Rate 03/31/22 2301 71     Resp 03/31/22 2301 18     Temp 03/31/22 2329 98 F (36.7 C)     Temp Source 03/31/22 2329 Oral     SpO2 03/31/22 2301 100 %     Weight 03/31/22 2313 151 lb (68.5 kg)     Height 03/31/22 2313 5\' 3"  (1.6 m)     Head Circumference --      Peak Flow --      Pain Score 03/31/22 2309 0     Pain Loc --      Pain Edu? --      Excl. in [place]? --     Most recent vital signs: Vitals:   03/31/22 2329 03/31/22 2330  BP:  (!) 147/55  Pulse:  (!) 59  Resp:  20  Temp: 98 F (36.7 C)   SpO2:  95%    General: Awake, no distress.  Looks well, pleasant and conversational in full sentences. CV:  Good peripheral perfusion.  Resp:  Normal effort.  Abd:  No distention.  MSK:  No deformity noted.  Neuro:  No focal deficits appreciated.  Lesha sensation to light touch on the right side of the body, strength intact throughout. Other:  Some facial asymmetry at rest, but with a large smile, she seems to draw the lips symmetrically   ED Results / Procedures / Treatments   Labs (all labs ordered are listed, but only abnormal results are displayed) Labs Reviewed   COMPREHENSIVE METABOLIC PANEL - Abnormal; Notable for the following components:      Result Value   Potassium 3.3 (*)    Glucose, Bld 104 (*)    All other components within normal limits  RESP PANEL BY RT-PCR (FLU A&B, COVID) ARPGX2  ETHANOL  PROTIME-INR  APTT  CBC  DIFFERENTIAL  URINE DRUG SCREEN, QUALITATIVE ([facility] ONLY)  URINALYSIS, ROUTINE W REFLEX MICROSCOPIC  CBG MONITORING, ED    EKG Sinus rhythm with a rate of 60 bpm.  Normal axis and intervals.  No clear signs of acute ischemia.  RADIOLOGY CT head interpreted by me without evidence of acute intracranial pathology  Official radiology report(s): CT HEAD CODE STROKE WO CONTRAST  Result Date: 03/31/2022 CLINICAL DATA:  Code stroke. Numbness to face and right side of body; left-sided facial droop EXAM: CT HEAD WITHOUT CONTRAST TECHNIQUE: Contiguous axial images were obtained from the base of the skull through the vertex without intravenous contrast. RADIATION DOSE REDUCTION: This exam was performed according to the  departmental dose-optimization program which includes automated exposure control, adjustment of the mA and/or kV according to patient size and/or use of iterative reconstruction technique. COMPARISON:  None Available. FINDINGS: Brain: No evidence of acute infarction, hemorrhage, cerebral edema, mass, mass effect, or midline shift. No hydrocephalus or extra-axial collection. Periventricular white matter changes, likely the sequela of chronic small vessel ischemic disease. Vascular: No hyperdense vessel. Skull: Negative for fracture or focal lesion. Sinuses/Orbits: No acute finding. Status post bilateral lens replacements. Other: The mastoid air cells are well aerated. ASPECTS [facility] Stroke Program Early CT Score) - Ganglionic level infarction (caudate, lentiform nuclei, internal capsule, insula, M1-M3 cortex): 7 - Supraganglionic infarction (M4-M6 cortex): 3 Total score (0-10 with 10 being normal): 10 IMPRESSION: 1. No acute  intracranial process. 2. ASPECTS is 10 Code stroke imaging results were communicated on 03/31/2022 at 11:15 pm to provider Delton Prairie via telephone, who verbally acknowledged these results. Electronically Signed   By: Wiliam Ke M.D.   On: 03/31/2022 23:17    PROCEDURES and INTERVENTIONS:  .1-3 Lead EKG Interpretation  Performed by: Delton Prairie, MD Authorized by: Delton Prairie, MD     Interpretation: normal     ECG rate:  61   ECG rate assessment: normal     Rhythm: sinus rhythm     Ectopy: none     Conduction: normal   .Critical Care  Performed by: Delton Prairie, MD Authorized by: Delton Prairie, MD   Critical care provider statement:    Critical care time (minutes):  30   Critical care time was exclusive of:  Separately billable procedures and treating other patients   Critical care was necessary to treat or prevent imminent or life-threatening deterioration of the following conditions:  CNS failure or compromise   Critical care was time spent personally by me on the following activities:  Development of treatment plan with patient or surrogate, discussions with consultants, evaluation of patient's response to treatment, examination of patient, ordering and review of laboratory studies, ordering and review of radiographic studies, ordering and performing treatments and interventions, pulse oximetry, re-evaluation of patient's condition and review of old charts   Medications  acetaminophen (TYLENOL) tablet 1,000 mg (has no administration in time range)     IMPRESSION / MDM / ASSESSMENT AND PLAN / ED COURSE  I reviewed the triage vital signs and the nursing notes.  Differential diagnosis includes, but is not limited to, stroke, seizure, complex migraine  {Patient presents with symptoms of an acute illness or injury that is potentially life-threatening.  81 year old female presents to the ED with right-sided sensation changes concerning for acute stroke requiring medical admission.   Only symptoms are sensation changes on the right, no weakness or cranial nerve deficits.  She seems to have asymmetrical facial muscles at rest, but symmetric with use and flexion.  Serum work-up with hypokalemia that is mild, no hematologic derangements or signs of toxic ingestions.  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  She was admitted to medicine.  Clinical Course as of 04/01/22 0453  Wed Mar 31, 2022  2352 Call from Neuro Telespecialist. No TNK. Admission. Continue aspirin.  [DS]    Clinical Course User Index [DS] Delton Prairie, MD     FINAL CLINICAL IMPRESSION(S) / ED DIAGNOSES   Final diagnoses:  None     Rx / DC Orders   ED Discharge Orders     None        Note:  This document  was prepared using Conservation officer, historic buildings and may include unintentional dictation errors.   Delton Prairie, MD 04/01/22 [phone]

## 2022-03-31 NOTE — Progress Notes (Signed)
2306 Code stroke activated, pt already in CT 2308 TS paged 2313 pt back from CT 2320 Dr Rozell Searing called [facility] to get pt story, states TS will be on camera shortly 2327 pt ambulated to BR with no difficulty 2329 Dr Rockwell [place] on camera

## 2022-04-01 ENCOUNTER — Observation Stay (HOSPITAL_COMMUNITY)
Admit: 2022-04-01 | Discharge: 2022-04-01 | Disposition: A | Discharge status: Home / Self Care | Payer: Medicare Other | Attending: Family Medicine | Admitting: Family Medicine

## 2022-04-01 ENCOUNTER — Encounter: Payer: Self-pay | Admitting: Family Medicine

## 2022-04-01 ENCOUNTER — Other Ambulatory Visit: Payer: Self-pay

## 2022-04-01 ENCOUNTER — Observation Stay: Payer: Medicare Other

## 2022-04-01 DIAGNOSIS — G8191 Hemiplegia, unspecified affecting right dominant side: Secondary | ICD-10-CM | POA: Diagnosis present

## 2022-04-01 DIAGNOSIS — Z83438 Family history of other disorder of lipoprotein metabolism and other lipidemia: Secondary | ICD-10-CM | POA: Diagnosis not present

## 2022-04-01 DIAGNOSIS — I6521 Occlusion and stenosis of right carotid artery: Secondary | ICD-10-CM

## 2022-04-01 DIAGNOSIS — G459 Transient cerebral ischemic attack, unspecified: Secondary | ICD-10-CM | POA: Diagnosis present

## 2022-04-01 DIAGNOSIS — E876 Hypokalemia: Secondary | ICD-10-CM | POA: Diagnosis present

## 2022-04-01 DIAGNOSIS — I6381 Other cerebral infarction due to occlusion or stenosis of small artery: Secondary | ICD-10-CM

## 2022-04-01 DIAGNOSIS — Z20822 Contact with and (suspected) exposure to covid-19: Secondary | ICD-10-CM | POA: Diagnosis present

## 2022-04-01 DIAGNOSIS — F32A Depression, unspecified: Secondary | ICD-10-CM | POA: Diagnosis present

## 2022-04-01 DIAGNOSIS — Z9049 Acquired absence of other specified parts of digestive tract: Secondary | ICD-10-CM | POA: Diagnosis not present

## 2022-04-01 DIAGNOSIS — R200 Anesthesia of skin: Secondary | ICD-10-CM | POA: Diagnosis not present

## 2022-04-01 DIAGNOSIS — I10 Essential (primary) hypertension: Secondary | ICD-10-CM | POA: Diagnosis present

## 2022-04-01 DIAGNOSIS — K219 Gastro-esophageal reflux disease without esophagitis: Secondary | ICD-10-CM | POA: Diagnosis present

## 2022-04-01 DIAGNOSIS — G4733 Obstructive sleep apnea (adult) (pediatric): Secondary | ICD-10-CM | POA: Diagnosis present

## 2022-04-01 DIAGNOSIS — R29701 NIHSS score 1: Secondary | ICD-10-CM | POA: Diagnosis present

## 2022-04-01 DIAGNOSIS — R001 Bradycardia, unspecified: Secondary | ICD-10-CM | POA: Diagnosis not present

## 2022-04-01 DIAGNOSIS — F419 Anxiety disorder, unspecified: Secondary | ICD-10-CM | POA: Diagnosis present

## 2022-04-01 DIAGNOSIS — Z87891 Personal history of nicotine dependence: Secondary | ICD-10-CM | POA: Diagnosis not present

## 2022-04-01 DIAGNOSIS — M1990 Unspecified osteoarthritis, unspecified site: Secondary | ICD-10-CM | POA: Diagnosis present

## 2022-04-01 DIAGNOSIS — Z8249 Family history of ischemic heart disease and other diseases of the circulatory system: Secondary | ICD-10-CM | POA: Diagnosis not present

## 2022-04-01 DIAGNOSIS — Z7982 Long term (current) use of aspirin: Secondary | ICD-10-CM | POA: Diagnosis not present

## 2022-04-01 DIAGNOSIS — Z823 Family history of stroke: Secondary | ICD-10-CM | POA: Diagnosis not present

## 2022-04-01 DIAGNOSIS — E785 Hyperlipidemia, unspecified: Secondary | ICD-10-CM | POA: Diagnosis present

## 2022-04-01 DIAGNOSIS — I6523 Occlusion and stenosis of bilateral carotid arteries: Secondary | ICD-10-CM | POA: Diagnosis present

## 2022-04-01 DIAGNOSIS — E538 Deficiency of other specified B group vitamins: Secondary | ICD-10-CM | POA: Diagnosis present

## 2022-04-01 DIAGNOSIS — Z9079 Acquired absence of other genital organ(s): Secondary | ICD-10-CM | POA: Diagnosis not present

## 2022-04-01 DIAGNOSIS — Z90722 Acquired absence of ovaries, bilateral: Secondary | ICD-10-CM | POA: Diagnosis not present

## 2022-04-01 DIAGNOSIS — Z9989 Dependence on other enabling machines and devices: Secondary | ICD-10-CM

## 2022-04-01 DIAGNOSIS — Z90710 Acquired absence of both cervix and uterus: Secondary | ICD-10-CM | POA: Diagnosis not present

## 2022-04-01 LAB — ECHOCARDIOGRAM COMPLETE BUBBLE STUDY
AR max vel: 2.18 cm2
AV Area VTI: 1.78 cm2
AV Area mean vel: 2.06 cm2
AV Mean grad: 2 mmHg
AV Peak grad: 4.5 mmHg
Ao pk vel: 1.06 m/s
Area-P 1/2: 2.84 cm2
S' Lateral: 2.57 cm

## 2022-04-01 LAB — URINE DRUG SCREEN, QUALITATIVE (ARMC ONLY)
Amphetamines, Ur Screen: NOT DETECTED
Barbiturates, Ur Screen: NOT DETECTED
Benzodiazepine, Ur Scrn: NOT DETECTED
Cannabinoid 50 Ng, Ur ~~LOC~~: NOT DETECTED
Cocaine Metabolite,Ur ~~LOC~~: NOT DETECTED
MDMA (Ecstasy)Ur Screen: NOT DETECTED
Methadone Scn, Ur: NOT DETECTED
Opiate, Ur Screen: NOT DETECTED
Phencyclidine (PCP) Ur S: NOT DETECTED
Tricyclic, Ur Screen: NOT DETECTED

## 2022-04-01 LAB — LIPID PANEL
Cholesterol: 240 mg/dL — ABNORMAL HIGH (ref 0–200)
HDL: 56 mg/dL (ref >40)
LDL Cholesterol: 158 mg/dL — ABNORMAL HIGH (ref 0–99)
Total CHOL/HDL Ratio: 4.3 RATIO
Triglycerides: 128 mg/dL (ref <150)
VLDL: 26 mg/dL (ref 0–40)

## 2022-04-01 LAB — URINALYSIS, ROUTINE W REFLEX MICROSCOPIC
Bilirubin Urine: NEGATIVE
Glucose, UA: NEGATIVE mg/dL
Hgb urine dipstick: NEGATIVE
Ketones, ur: NEGATIVE mg/dL
Leukocytes,Ua: NEGATIVE
Nitrite: NEGATIVE
Protein, ur: NEGATIVE mg/dL
Specific Gravity, Urine: 1.005 (ref 1.005–1.030)
pH: 6 (ref 5.0–8.0)

## 2022-04-01 LAB — HEMOGLOBIN A1C
Hgb A1c MFr Bld: 5.2 % (ref 4.8–5.6)
Mean Plasma Glucose: 102.54 mg/dL

## 2022-04-01 LAB — RESP PANEL BY RT-PCR (FLU A&B, COVID) ARPGX2
Influenza A by PCR: NEGATIVE
Influenza B by PCR: NEGATIVE
SARS Coronavirus 2 by RT PCR: NEGATIVE

## 2022-04-01 LAB — TSH: TSH: 4.023 u[IU]/mL (ref 0.350–4.500)

## 2022-04-01 LAB — MAGNESIUM: Magnesium: 1.8 mg/dL (ref 1.7–2.4)

## 2022-04-01 MED ORDER — VITAMIN B-12 1000 MCG PO TABS
1000.0000 ug | ORAL_TABLET | Freq: Every day | ORAL | Status: DC
  Administered 2022-04-01 – 2022-04-02 (×2): 1000 ug via ORAL
  Filled 2022-04-01 (×2): qty 1

## 2022-04-01 MED ORDER — CLOPIDOGREL BISULFATE 75 MG PO TABS
75.0000 mg | ORAL_TABLET | Freq: Every day | ORAL | Status: DC
  Administered 2022-04-01 – 2022-04-02 (×2): 75 mg via ORAL
  Filled 2022-04-01 (×2): qty 1

## 2022-04-01 MED ORDER — POTASSIUM CHLORIDE 20 MEQ PO PACK
40.0000 meq | PACK | Freq: Once | ORAL | Status: AC
  Administered 2022-04-01: 40 meq via ORAL
  Filled 2022-04-01: qty 2

## 2022-04-01 MED ORDER — ACETAMINOPHEN 650 MG RE SUPP: 650.0000 mg | RECTAL | Status: DC | PRN

## 2022-04-01 MED ORDER — HYDROCODONE-ACETAMINOPHEN 5-325 MG PO TABS: 1.0000 | ORAL_TABLET | ORAL | Status: DC | PRN

## 2022-04-01 MED ORDER — FLUTICASONE PROPIONATE 50 MCG/ACT NA SUSP: 1.0000 | Freq: Every evening | NASAL | Status: DC | PRN

## 2022-04-01 MED ORDER — ASPIRIN 81 MG PO TBEC
81.0000 mg | DELAYED_RELEASE_TABLET | Freq: Every day | ORAL | Status: DC
  Administered 2022-04-01 – 2022-04-02 (×2): 81 mg via ORAL
  Filled 2022-04-01 (×2): qty 1

## 2022-04-01 MED ORDER — STROKE: EARLY STAGES OF RECOVERY BOOK: Freq: Once | Status: AC

## 2022-04-01 MED ORDER — ATORVASTATIN CALCIUM 20 MG PO TABS
10.0000 mg | ORAL_TABLET | Freq: Every day | ORAL | Status: DC
Start: 2022-04-01 — End: 2022-04-01

## 2022-04-01 MED ORDER — ACETAMINOPHEN 160 MG/5ML PO SOLN: 650.0000 mg | ORAL | Status: DC | PRN

## 2022-04-01 MED ORDER — PANTOPRAZOLE SODIUM 40 MG PO TBEC
40.0000 mg | DELAYED_RELEASE_TABLET | ORAL | Status: DC
  Administered 2022-04-01: 40 mg via ORAL
  Filled 2022-04-01 (×2): qty 1

## 2022-04-01 MED ORDER — LOSARTAN POTASSIUM 50 MG PO TABS
100.0000 mg | ORAL_TABLET | Freq: Every day | ORAL | Status: DC
  Administered 2022-04-01 – 2022-04-02 (×2): 100 mg via ORAL
  Filled 2022-04-01 (×2): qty 2

## 2022-04-01 MED ORDER — MONTELUKAST SODIUM 10 MG PO TABS
10.0000 mg | ORAL_TABLET | Freq: Every day | ORAL | Status: DC
  Administered 2022-04-01: 10 mg via ORAL
  Filled 2022-04-01: qty 1

## 2022-04-01 MED ORDER — SODIUM CHLORIDE 0.9 % IV SOLN: INTRAVENOUS | Status: DC

## 2022-04-01 MED ORDER — TRAZODONE HCL 50 MG PO TABS: 25.0000 mg | ORAL_TABLET | Freq: Every evening | ORAL | Status: DC | PRN

## 2022-04-01 MED ORDER — HYDROCHLOROTHIAZIDE 12.5 MG PO TABS
12.5000 mg | ORAL_TABLET | Freq: Every day | ORAL | Status: DC
  Administered 2022-04-01 – 2022-04-02 (×2): 12.5 mg via ORAL
  Filled 2022-04-01 (×2): qty 1

## 2022-04-01 MED ORDER — APOAEQUORIN 20 MG PO CAPS: 1.0000 | ORAL_CAPSULE | Freq: Every day | ORAL | Status: DC

## 2022-04-01 MED ORDER — PRENATAL GUMMIES 0.18-25 MG PO CHEW: CHEWABLE_TABLET | Freq: Every day | ORAL | Status: DC

## 2022-04-01 MED ORDER — METHOCARBAMOL 750 MG PO TABS
750.0000 mg | ORAL_TABLET | Freq: Three times a day (TID) | ORAL | Status: DC | PRN
Start: 2022-04-01 — End: 2022-04-02

## 2022-04-01 MED ORDER — SENNOSIDES-DOCUSATE SODIUM 8.6-50 MG PO TABS: 1.0000 | ORAL_TABLET | Freq: Every evening | ORAL | Status: DC | PRN

## 2022-04-01 MED ORDER — ENOXAPARIN SODIUM 40 MG/0.4ML IJ SOSY: 40.0000 mg | PREFILLED_SYRINGE | INTRAMUSCULAR | Status: DC

## 2022-04-01 MED ORDER — ACETAMINOPHEN 325 MG PO TABS: 650.0000 mg | ORAL_TABLET | ORAL | Status: DC | PRN

## 2022-04-01 MED ORDER — ENOXAPARIN SODIUM 40 MG/0.4ML IJ SOSY
40.0000 mg | PREFILLED_SYRINGE | INTRAMUSCULAR | Status: DC
  Administered 2022-04-01 – 2022-04-02 (×2): 40 mg via SUBCUTANEOUS
  Filled 2022-04-01 (×2): qty 0.4

## 2022-04-01 MED ORDER — ONDANSETRON HCL 4 MG/2ML IJ SOLN: 4.0000 mg | INTRAMUSCULAR | Status: DC | PRN

## 2022-04-01 NOTE — H&P (Signed)
[place]   PATIENT NAME: Shannon [name]    MR#:  [id number]  DATE OF BIRTH:  [birth year]  DATE OF ADMISSION:  03/31/2022  PRIMARY CARE PHYSICIAN: Ethelda Chick, MD   Patient is coming from: Home  REQUESTING/REFERRING PHYSICIAN: Delton Prairie, MD  CHIEF COMPLAINT:   Chief Complaint  Patient presents with   Code Stroke    HISTORY OF PRESENT ILLNESS:  Shannon [name] is a 81 y.o. Caucasian female with medical history significant for anxiety, depression, GERD, essential hypertension, dyslipidemia and OSA on CPAP, who presented to the emergency room with acute onset of right-sided upper and lower extremity numbness with associated headache that started earlier this evening at 9:45 PM.  Her headache is resolved and it was brief but her numbness continued.  She denied focal muscle weakness, dysphagia or dysarthria.  No facial droop.  No tinnitus or vertigo.  No chest pain or palpitations.  No cough or wheezing or dyspnea.  ED Course: Upon presentation to the emergency room, BP 147/55 with heart rate of 59 with otherwise normal vital signs.  Labs revealed a potassium of 3.3 and otherwise unremarkable CMP and CBC.  Urinalysis came back negative.  Urine drug screen came back negative.  Influenza antigens and COVID-19 PCR came back negative. EKG as reviewed by me : EKG showed sinus rhythm with a rate of 60  The patient will be admitted to an observation medical telemetry bed for further evaluation and management.  PAST MEDICAL HISTORY:   Past Medical History:  Diagnosis Date   Allergy    Anxiety    Arthritis    osteoarthritis   Cataract    surgery both eyes 04/22/2011 implants   Depression    Diverticulitis    Essential hypertension    GERD (gastroesophageal reflux disease)    Heart murmur    History of cataract    Hyperlipidemia    Osteoporosis    Situational mixed anxiety and depressive disorder    Sleep apnea    cpap use    PAST SURGICAL HISTORY:   Past Surgical  History:  Procedure Laterality Date   APPENDECTOMY     BLEPHAROPLASTY Bilateral 2016   CATARACT EXTRACTION Bilateral 2012   CHOLECYSTECTOMY     COLONOSCOPY     COLONOSCOPY WITH PROPOFOL N/A 09/17/2020   Procedure: COLONOSCOPY WITH PROPOFOL;  Surgeon: Toledo, Boykin Nearing, MD;  Location: [facility];  Service: Gastroenterology;  Laterality: N/A;   HERNIA REPAIR     HYSTERECTOMY ABDOMINAL WITH SALPINGO-OOPHORECTOMY  1987   and bladder tuck   KNEE ARTHROSCOPY     LAPAROSCOPIC COLON RESECTION  2014   TOTAL HIP ARTHROPLASTY Left 08/24/2018   TOTAL HIP ARTHROPLASTY Right 01/20/2022   Procedure: TOTAL HIP ARTHROPLASTY ANTERIOR APPROACH;  Surgeon: Lyndle Herrlich, MD;  Location: [facility] ORS;  Service: Orthopedics;  Laterality: Right;   TOTAL KNEE ARTHROPLASTY Right 04/11/2019   vein ligations      SOCIAL HISTORY:   Social History   Tobacco Use   Smoking status: Former    Packs/day: 0.25    Years: 15.00    Total pack years: 3.75    Types: Cigarettes    Quit date: 08/09/1968    Years since quitting: 53.6   Smokeless tobacco: Never  Substance Use Topics   Alcohol use: Not Currently    FAMILY HISTORY:   Family History  Problem Relation Age of Onset   Cancer Father    Osteoarthritis Father  Ulcers Father    Cancer Sister        liver, lungs   Arthritis Sister        rheumatoid   Hypertension Sister    Hyperlipidemia Sister    Diabetes Sister    GI Disease Sister    Stroke Sister    Liver disease Sister    Arthritis Sister    Diabetes Brother    Osteoarthritis Brother    Skin cancer Brother    Cancer Brother        pancreatic, kidney   Breast cancer Niece        late 40's   Breast cancer Niece     DRUG ALLERGIES:   Allergies  Allergen Reactions   Augmentin [Amoxicillin-Pot Clavulanate]    Calcium-Containing Compounds Nausea Only   Ciprofloxacin Nausea Only   Clams [Shellfish Allergy] Diarrhea and Nausea Only   Contrast Media [Iodinated Contrast Media]     Flagyl [Metronidazole]    Statins    Tape     Adhesive tape-silicones    REVIEW OF SYSTEMS:   ROS As per history of present illness. All pertinent systems were reviewed above. Constitutional, HEENT, cardiovascular, respiratory, GI, GU, musculoskeletal, neuro, psychiatric, endocrine, integumentary and hematologic systems were reviewed and are otherwise negative/unremarkable except for positive findings mentioned above in the HPI.   MEDICATIONS AT HOME:   Prior to Admission medications   Medication Sig Start Date End Date Taking? Authorizing Provider  Acetaminophen (TYLENOL ARTHRITIS PAIN PO) Take 1 tablet by mouth as needed (arthritis pain).    [provider]  Apoaequorin (PREVAGEN EXTRA STRENGTH) 20 MG CAPS Take 1 capsule by mouth daily.    [provider]  aspirin 81 MG chewable tablet Chew 1 tablet (81 mg total) by mouth 2 (two) times daily. 01/21/22   Altamese Cabal, PA-C  Cyanocobalamin ([employer] B12 GUMMIES PO) Take 1,000 mcg by mouth daily.    [provider]  docusate sodium (COLACE) 100 MG capsule Take 1 capsule (100 mg total) by mouth 2 (two) times daily. 01/21/22   Altamese Cabal, PA-C  fluticasone (FLONASE) 50 MCG/ACT nasal spray Place 1 spray into both nostrils at bedtime as needed for allergies.    [provider]  hydrochlorothiazide (HYDRODIURIL) 12.5 MG tablet Take 12.5 mg by mouth daily.    [provider]  HYDROcodone-acetaminophen (NORCO/VICODIN) 5-325 MG tablet Take 1 tablet by mouth every 4 (four) hours as needed for moderate pain (pain score 4-6). 01/21/22   Altamese Cabal, PA-C  hydrocortisone (ANUSOL-HC) 25 MG suppository Place 25 mg rectally 2 (two) times daily as needed for hemorrhoids. 10/03/19   [provider]  hydrocortisone 2.5 % cream Apply 1 Application topically 2 (two) times daily as needed (prn).    [provider]  ketotifen (ZADITOR) 0.025 % ophthalmic solution Place 1 drop into both eyes as needed  (prn).    [provider]  losartan (COZAAR) 100 MG tablet Take 100 mg by mouth daily. 11/19/19   [provider]  meloxicam (MOBIC) 15 MG tablet Take 15 mg by mouth daily as needed for pain. 01/03/20   [provider]  methocarbamol (ROBAXIN-750) 750 MG tablet Take 1 tablet (750 mg total) by mouth every 8 (eight) hours as needed for muscle spasms. 01/21/22   Altamese Cabal, PA-C  montelukast (SINGULAIR) 10 MG tablet Take 10 mg by mouth at bedtime. 11/19/19 01/14/22  [provider]  pantoprazole (PROTONIX) 40 MG tablet Take 40 mg by mouth every other  day. 05/24/19   [provider]  Prenatal MV & Min w/FA-DHA (PRENATAL GUMMIES PO) Take 1 tablet by mouth daily.    [provider]  Prenatal Vit-Fe Fumarate-FA (PRENATAL ONE DAILY PO) Take by mouth.    [provider]  valACYclovir (VALTREX) 1000 MG tablet Take 1,000 mg by mouth 2 (two) times daily as needed (prn).    [provider]      VITAL SIGNS:  Blood pressure (!) 164/65, pulse (!) 54, temperature 98 F (36.7 C), temperature source Oral, resp. rate (!) 21, height 5\' 3"  (1.6 m), weight 71.4 kg, SpO2 95 %.  PHYSICAL EXAMINATION:  Physical Exam  GENERAL:  81 y.o.-year-old Caucasian female patient lying in the bed with no acute distress.  EYES: Pupils equal, round, reactive to light and accommodation. No scleral icterus. Extraocular muscles intact.  HEENT: Head atraumatic, normocephalic. Oropharynx and nasopharynx clear.  NECK:  Supple, no jugular venous distention. No thyroid enlargement, no tenderness.  LUNGS: Normal breath sounds bilaterally, no wheezing, rales,rhonchi or crepitation. No use of accessory muscles of respiration.  CARDIOVASCULAR: Regular rate and rhythm, S1, S2 normal. No murmurs, rubs, or gallops.  ABDOMEN: Soft, nondistended, nontender. Bowel sounds present. No organomegaly or mass.  EXTREMITIES: No pedal edema, cyanosis, or clubbing.  NEUROLOGIC:  Cranial nerves II through XII are intact. Muscle strength 5/5 in all extremities. Sensation intact. Gait not checked.  PSYCHIATRIC: The patient is alert and oriented x 3.  Normal affect and good eye contact. SKIN: No obvious rash, lesion, or ulcer.   LABORATORY PANEL:   CBC Recent Labs  Lab 03/31/22 2301  WBC 6.5  HGB 12.5  HCT 37.3  PLT 248   ------------------------------------------------------------------------------------------------------------------  Chemistries  Recent Labs  Lab 03/31/22 2301  NA 141  K 3.3*  CL 106  CO2 27  GLUCOSE 104*  BUN 11  CREATININE 0.64  CALCIUM 8.9  AST 24  ALT 17  ALKPHOS 48  BILITOT 1.1   ------------------------------------------------------------------------------------------------------------------  Cardiac Enzymes No results for input(s): "TROPONINI" in the last 168 hours. ------------------------------------------------------------------------------------------------------------------  RADIOLOGY:  MR BRAIN WO CONTRAST  Result Date: 04/01/2022 CLINICAL DATA:  Sudden onset numbness to face and entire right side of body; mild left facial droop EXAM: MRI HEAD WITHOUT CONTRAST TECHNIQUE: Multiplanar, multiecho pulse sequences of the brain and surrounding structures were obtained without intravenous contrast. COMPARISON:  No prior MRI, correlation is made with CT head 03/31/2022 FINDINGS: Brain: 6 mm focus of restricted diffusion in the left thalamus (series 5, image 22), with ADC correlate (series 6, image 22). No acute hemorrhage, mass, mass effect, or midline shift. No hydrocephalus or extra-axial collection. No hemosiderin deposition to suggest remote hemorrhage. Scattered T2 hyperintense signal in the periventricular white matter, likely the sequela of mild chronic small vessel ischemic disease. Vascular: Normal arterial flow voids. Skull and upper cervical spine: Normal marrow signal. Sinuses/Orbits: No acute finding. Status post  bilateral lens replacements. Other: The mastoids are well aerated. 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 No additional acute intracranial process. These results were called by telephone at the time of interpretation on 04/01/2022 at 2:01 am to provider [facility] , who verbally acknowledged these results. Electronically Signed   By: [facility] M.D.   On: 04/01/2022 02:01   CT HEAD CODE STROKE WO CONTRAST  Result Date: 03/31/2022 CLINICAL DATA:  Code stroke. Numbness to face and right side of body; left-sided facial droop EXAM: CT HEAD WITHOUT CONTRAST TECHNIQUE: Contiguous axial images were obtained  from the base of the skull through the vertex without intravenous contrast. RADIATION DOSE REDUCTION: This exam was performed according to the departmental dose-optimization program which includes automated exposure control, adjustment of the mA and/or kV according to patient size and/or use of iterative reconstruction technique. COMPARISON:  None Available. FINDINGS: Brain: No evidence of acute infarction, hemorrhage, cerebral edema, mass, mass effect, or midline shift. No hydrocephalus or extra-axial collection. Periventricular white matter changes, likely the sequela of chronic small vessel ischemic disease. Vascular: No hyperdense vessel. Skull: Negative for fracture or focal lesion. Sinuses/Orbits: No acute finding. Status post bilateral lens replacements. Other: The mastoid air cells are well aerated. ASPECTS [facility] Stroke Program Early CT Score) - Ganglionic level infarction (caudate, lentiform nuclei, internal capsule, insula, M1-M3 cortex): 7 - Supraganglionic infarction (M4-M6 cortex): 3 Total score (0-10 with 10 being normal): 10 IMPRESSION: 1. No acute intracranial process. 2. ASPECTS is 10 Code stroke imaging results were communicated on 03/31/2022 at 11:15 pm to provider Delton Prairie via telephone, who verbally acknowledged these results. Electronically Signed   By: Wiliam Ke M.D.    On: 03/31/2022 23:17      IMPRESSION AND PLAN:  Assessment and Plan: * Right thalamic infarction [facility]) - The patient will be admitted to an observation medically monitored bed. - Her brain MRI came back positive for acute small left thalamic infarction.  - This is manifested by right-sided numbness. - We will follow neuro checks q.4 hours for 24 hours.   - The patient will be placed on aspirin and Plavix.   - Will obtain  bilateral carotid Doppler and 2D echo with bubble study .   - A neurology consultation  as well as physical/occupation/speech therapy consults will be obtained in a.m.Marland Kitchen   - I notified Dr. Wilford Corner about the patient. - The patient was intolerant to multiple statins in the past and refuses them.  Fasting lipids will be checked.  Essential hypertension - We will continue her antihypertensives with permissive parameters.  OSA on CPAP - We will resume her CPAP nightly.  Vitamin B12 deficiency - We will continue Vitamin B12.   DVT prophylaxis: Lovenox.  Advanced Care Planning:  Code Status: full code.  Family Communication:  The plan of care was discussed in details with the patient (and family). I answered all questions. The patient agreed to proceed with the above mentioned plan. Further management will depend upon hospital course. Disposition Plan: Back to previous home environment Consults called: Neurology All the records are reviewed and case discussed with ED provider.  Status is: Observation  I certify that at the time of admission, it is my clinical judgment that the patient will require hospital care extending less than 2 midnights.                            Dispo: The patient is from: Home              Anticipated d/c is to: Home              Patient currently is not medically stable to d/c.              Difficult to place patient: No  Hannah Beat M.D on 04/01/2022 at 2:20 AM  [facility]   From 7 PM-7 AM, contact  night-coverage [email or web address]  CC: Primary care physician; Ethelda Chick, MD

## 2022-04-01 NOTE — TOC Initial Note (Signed)
Transition of Care [facility]) - Initial/Assessment Note    Patient Details  Name: Shannon [name] MRN: [id number] Date of Birth: [birth year]  Transition of [facility]) CM/SW Contact:    Truddie Hidden, RN Phone Number: 04/01/2022, 3:04 PM  Clinical Narrative:                 PT/OT consults pending evaluation. Will reassess for disposition and DME needs.         Patient Goals and CMS Choice        Expected Discharge Plan and Services                                                Prior Living Arrangements/Services                       Activities of Daily Living Home Assistive Devices/Equipment: Eyeglasses ADL Screening (condition at time of admission) Patient's cognitive ability adequate to safely complete daily activities?: Yes Is the patient deaf or have difficulty hearing?: No Does the patient have difficulty seeing, even when wearing glasses/contacts?: No Does the patient have difficulty concentrating, remembering, or making decisions?: No Patient able to express need for assistance with ADLs?: Yes Does the patient have difficulty dressing or bathing?: No Independently performs ADLs?: Yes (appropriate for developmental age) Does the patient have difficulty walking or climbing stairs?: No Weakness of Legs: None Weakness of Arms/Hands: None  Permission Sought/Granted                  Emotional Assessment              Admission diagnosis:  TIA (transient ischemic attack) [G45.9] Right sided numbness [R20.0] Patient Active Problem List   Diagnosis Date Noted   Vitamin B12 deficiency 04/01/2022   Right thalamic infarction (HCC) 04/01/2022   Carotid atherosclerosis, bilateral 04/01/2022   Internal carotid artery stenosis, right 04/01/2022   Right sided numbness    History of total hip replacement, right 01/20/2022   OSA on CPAP 06/02/2020   CPAP use counseling 06/02/2020   Seasonal allergies 06/02/2020   Essential hypertension  12/25/2015   PCP:  Ethelda Chick, MD Pharmacy:   [facility] - [place], [place] - [address] [place] [address] Phone: [phone] Fax: [phone]     Social Determinants of Health (SDOH) Interventions    Readmission Risk Interventions     No data to display

## 2022-04-01 NOTE — Assessment & Plan Note (Addendum)
-   The patient will be admitted to an observation medically monitored bed. - Her brain MRI came back positive for acute small left thalamic infarction.  - This is manifested by right-sided numbness. - We will follow neuro checks q.4 hours for 24 hours.   - The patient will be placed on aspirin and Plavix.   - Will obtain  bilateral carotid Doppler and 2D echo with bubble study .   - A neurology consultation  as well as physical/occupation/speech therapy consults will be obtained in a.m.Marland Kitchen   - I notified Dr. Wilford Corner about the patient. - The patient was intolerant to multiple statins in the past and refuses them.  Fasting lipids will be checked.

## 2022-04-01 NOTE — Hospital Course (Addendum)
Shannon [name] is a 81 y.o. Caucasian female with medical history significant for anxiety, depression, GERD, essential hypertension, dyslipidemia and OSA on CPAP, who presented to the emergency room with acute onset of right-sided upper and lower extremity numbness with associated headache that started earlier this evening at 9:45 PM.  Her headache is resolved and it was brief but her numbness continued.  She denied focal muscle weakness, dysphagia or dysarthria.  No facial droop.  No tinnitus or vertigo.  09/06: BP 147/55 with heart rate of 59 with otherwise normal vital signs.  Labs revealed a potassium of 3.3 and otherwise unremarkable CMP and CBC.  Urinalysis came back negative.  Urine drug screen came back negative.  Influenza antigens and COVID-19 PCR came back negative. EKG showed sinus rhythm with a rate of 60.  MRI brain acute small left thalamic infarction.  Started aspirin/Plavix.  Pending carotid Doppler, 2D echo.  Patient declines statin. 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  Echocardiogram pending. Neurology recommendations: MRA pending. Aspirin 81 Plavix 75 - duration to be determined after vessel imaging completion.     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   aspirin and Plavix.  Declined statin.   carotid Doppler: (+) focal heterogeneous plaque at the right cervical ICA origin, approx 50% origin stenosis. The above flow velocities and ICA/CCA ratio of 2.2 are both consistent with a degree of stenosis of 50-69%. Scattered calcific plaques in the  proximal cervical left ICA, but visually are nonstenosing with flow velocities and ICA/CCA ratio compatible with no hemodynamically significant (50% or less) stenosis. Bilateral antegrade vertebral artery flow. 2D echo with bubble study pending MRA pending - will determine Plavix/ASA duration Lipids: LDL 158 --> consider PCSK9 inhibitor outpatient  neurology consultation as well as physical/occupation/speech therapy consults  Essential hypertension Antihypertensives PRN >220/120 or there is a concern for end organ damage/contraindications for permissive HTN. If blood pressure is greater than 220/120 give labetalol PO or IV or Vasotec IV with a goal of 15% reduction in BP during the first 24 hours. continue her antihypertensives with permissive parameters.  Bradycardia, nocturnal and asymptomatic Continue telemetry TSH  Vitamin B12 deficiency continue Vitamin B12.  OSA on CPAP CPAP nightly.        DVT prophylaxis: Lovenox Pertinent IV fluids/nutrition: continuous IVF d/c this morning, taking po after passed swallow eval  Central lines / invasive devices: n/a  Code Status: FULL Family Communication: neurology spoke w/ family   Disposition: inpatient, anticipate d/c back home TOC needs: none at this time, pend PT/OT eval Barriers to discharge / significant pending items: Neuro recs, Echo/MRA results

## 2022-04-01 NOTE — ED Notes (Signed)
Confirmed with Mansy, MD that pt to receive KLor-Con pack on awakening and then again 2 hours after first administration.

## 2022-04-01 NOTE — Progress Notes (Signed)
Made night RN Luisa Hart aware of 9 beat run of Vtach.

## 2022-04-01 NOTE — Consult Note (Signed)
Neurology Consultation  Reason for Consult: stroke Referring Physician: Dr Arville Care  CC: right sided numbness  History is obtained from: Patient, daughter on the phone  HPI: Shannon [name] is a 81 y.o. female past medical history of hypertension, hyperlipidemia, sleep apnea, anxiety, depression presented to the emergency room for sudden onset of right-sided numbness that started when she was getting ready for bed with last known well at 9:45 PM on 03/31/2022.  She said that she felt that her right hand and foot went numb and then the right arm was involved.  She was able to move her hand but it felt funny.  She took an aspirin and called EMS.  She also complained of a small headache at that time.  She was evaluated in the emergency room, code stroke activated and she was evaluated by the telestroke specialist, who deemed deficits too mild to treat and recommended admission for stroke work-up.  MRI revealed a left thalamic stroke. There was also questionable left facial droop but the family thought that her face is probably all baseline.   LKW: 9:45 PM 03/31/2022 IV thrombolysis given?: no, determined by telestroke specialist to be too mild to treat Premorbid modified Rankin scale (mRS): 0   ROS: Full ROS was performed and is negative except as noted in the HPI  Past Medical History:  Diagnosis Date   Allergy    Anxiety    Arthritis    osteoarthritis   Cataract    surgery both eyes 04/22/2011 implants   Depression    Diverticulitis    Essential hypertension    GERD (gastroesophageal reflux disease)    Heart murmur    History of cataract    Hyperlipidemia    Osteoporosis    Situational mixed anxiety and depressive disorder    Sleep apnea    cpap use     Family History  Problem Relation Age of Onset   Cancer Father    Osteoarthritis Father    Ulcers Father    Cancer Sister        liver, lungs   Arthritis Sister        rheumatoid   Hypertension Sister    Hyperlipidemia Sister     Diabetes Sister    GI Disease Sister    Stroke Sister    Liver disease Sister    Arthritis Sister    Diabetes Brother    Osteoarthritis Brother    Skin cancer Brother    Cancer Brother        pancreatic, kidney   Breast cancer Niece        late 24's   Breast cancer Niece      Social History:   reports that she quit smoking about 53 years ago. Her smoking use included cigarettes. She has a 3.75 pack-year smoking history. She has never used smokeless tobacco. She reports that she does not currently use alcohol. She reports that she does not use drugs.  Medications  Current Facility-Administered Medications:    [START ON 04/02/2022]  stroke: early stages of recovery book, , Does not apply, Once, Mansy, Jan A, MD   acetaminophen (TYLENOL) tablet 650 mg, 650 mg, Oral, Q4H PRN **OR** acetaminophen (TYLENOL) 160 MG/5ML solution 650 mg, 650 mg, Per Tube, Q4H PRN **OR** acetaminophen (TYLENOL) suppository 650 mg, 650 mg, Rectal, Q4H PRN, Mansy, Jan A, MD   acetaminophen (TYLENOL) tablet 1,000 mg, 1,000 mg, Oral, Once, [place], MD   aspirin EC tablet 81 mg, 81 mg, Oral,  Daily, Mansy, Jan A, MD   clopidogrel (PLAVIX) tablet 75 mg, 75 mg, Oral, Daily, Mansy, Jan A, MD   cyanocobalamin (VITAMIN B12) tablet 1,000 mcg, 1,000 mcg, Oral, Daily, Mansy, Jan A, MD   enoxaparin (LOVENOX) injection 40 mg, 40 mg, Subcutaneous, Q24H, Belue, Nathan S, RPH   hydrochlorothiazide (HYDRODIURIL) tablet 12.5 mg, 12.5 mg, Oral, Daily, Mansy, Jan A, MD   HYDROcodone-acetaminophen (NORCO/VICODIN) 5-325 MG per tablet 1 tablet, 1 tablet, Oral, Q4H PRN, Mansy, Jan A, MD   losartan (COZAAR) tablet 100 mg, 100 mg, Oral, Daily, Mansy, Jan A, MD   methocarbamol (ROBAXIN) tablet 750 mg, 750 mg, Oral, Q8H PRN, Mansy, Jan A, MD   montelukast (SINGULAIR) tablet 10 mg, 10 mg, Oral, QHS, Mansy, Jan A, MD   ondansetron [facility]) injection 4 mg, 4 mg, Intravenous, Q4H PRN, Mansy, Jan A, MD   pantoprazole (PROTONIX) EC  tablet 40 mg, 40 mg, Oral, QODAY, Mansy, Jan A, MD   potassium chloride (KLOR-CON) packet 40 mEq, 40 mEq, Oral, Once, Mansy, Jan A, MD   potassium chloride (KLOR-CON) packet 40 mEq, 40 mEq, Oral, Once, Mansy, Jan A, MD   senna-docusate (Senokot-S) tablet 1 tablet, 1 tablet, Oral, QHS PRN, Mansy, Jan A, MD   traZODone (DESYREL) tablet 25 mg, 25 mg, Oral, QHS PRN, Mansy, Jan A, MD  Current Outpatient Medications:    Acetaminophen (TYLENOL ARTHRITIS PAIN PO), Take 1 tablet by mouth as needed (arthritis pain)., Disp: , Rfl:    Apoaequorin (PREVAGEN EXTRA STRENGTH) 20 MG CAPS, Take 1 capsule by mouth daily., Disp: , Rfl:    aspirin 81 MG chewable tablet, Chew 1 tablet (81 mg total) by mouth 2 (two) times daily., Disp: 60 tablet, Rfl: 0   Cyanocobalamin ([employer] B12 GUMMIES PO), Take 1,000 mcg by mouth daily., Disp: , Rfl:    docusate sodium (COLACE) 100 MG capsule, Take 1 capsule (100 mg total) by mouth 2 (two) times daily., Disp: 30 capsule, Rfl: 0   fluticasone (FLONASE) 50 MCG/ACT nasal spray, Place 1 spray into both nostrils at bedtime as needed for allergies., Disp: , Rfl:    hydrochlorothiazide (HYDRODIURIL) 12.5 MG tablet, Take 12.5 mg by mouth daily., Disp: , Rfl:    HYDROcodone-acetaminophen (NORCO/VICODIN) 5-325 MG tablet, Take 1 tablet by mouth every 4 (four) hours as needed for moderate pain (pain score 4-6)., Disp: 30 tablet, Rfl: 0   hydrocortisone (ANUSOL-HC) 25 MG suppository, Place 25 mg rectally 2 (two) times daily as needed for hemorrhoids., Disp: , Rfl:    hydrocortisone 2.5 % cream, Apply 1 Application topically 2 (two) times daily as needed (prn)., Disp: , Rfl:    ketotifen (ZADITOR) 0.025 % ophthalmic solution, Place 1 drop into both eyes as needed (prn)., Disp: , Rfl:    losartan (COZAAR) 100 MG tablet, Take 100 mg by mouth daily., Disp: , Rfl:    meloxicam (MOBIC) 15 MG tablet, Take 15 mg by mouth daily as needed for pain., Disp: , Rfl:    methocarbamol (ROBAXIN-750) 750 MG  tablet, Take 1 tablet (750 mg total) by mouth every 8 (eight) hours as needed for muscle spasms., Disp: 60 tablet, Rfl: 1   montelukast (SINGULAIR) 10 MG tablet, Take 10 mg by mouth at bedtime., Disp: , Rfl:    pantoprazole (PROTONIX) 40 MG tablet, Take 40 mg by mouth every other day., Disp: , Rfl:    Prenatal MV & Min w/FA-DHA (PRENATAL GUMMIES PO), Take 1 tablet by mouth daily., Disp: , Rfl:  Prenatal Vit-Fe Fumarate-FA (PRENATAL ONE DAILY PO), Take by mouth., Disp: , Rfl:    valACYclovir (VALTREX) 1000 MG tablet, Take 1,000 mg by mouth 2 (two) times daily as needed (prn)., Disp: , Rfl:   Exam: Current vital signs: BP (!) 165/82   Pulse (!) 50   Temp 97.7 F (36.5 C) (Oral)   Resp 14   Ht 5\' 3"  (1.6 m)   Wt 71.4 kg   SpO2 96%   BMI 27.88 kg/m  Vital signs in last 24 hours: Temp:  [97.7 F (36.5 C)-98 F (36.7 C)] 97.7 F (36.5 C) (09/07 0540) Pulse Rate:  [48-71] 50 (09/07 0522) Resp:  [13-21] 14 (09/07 0623) BP: (147-180)/(50-99) 165/82 (09/07 0540) SpO2:  [95 %-100 %] 96 % (09/07 0522) Weight:  [68.5 kg-71.4 kg] 71.4 kg (09/06 2314)  GENERAL: Awake, alert in NAD HEENT: - Normocephalic and atraumatic, dry mm, no LN++, no Thyromegally LUNGS - Clear to auscultation bilaterally with no wheezes CV - S1S2 RRR, no m/r/g, equal pulses bilaterally. ABDOMEN - Soft, nontender, nondistended with normoactive BS Ext: warm, well perfused, intact peripheral pulses, no edema  NEURO:  Mental Status: AA&Ox3  Language: speech is nondysarthric.  Naming, repetition, fluency, and comprehension intact. Cranial Nerves: PERRL EOMI, visual fields full, no facial asymmetry, facial sensation diminished on the right, hearing intact, tongue/uvula/soft palate midline, normal sternocleidomastoid and trapezius muscle strength. No evidence of tongue atrophy or fibrillations Motor: No drift in any of the 4 extremities Tone: is normal and bulk is normal Sensation-intact on all fours with subjective  right-sided mild numbness Coordination: FTN intact bilaterally, no ataxia in BLE. Gait- deferred  NIHSS-1  Labs I have reviewed labs in epic and the results pertinent to this consultation are:  CBC    Component Value Date/Time   WBC 6.5 03/31/2022 2301   RBC 3.90 03/31/2022 2301   HGB 12.5 03/31/2022 2301   HCT 37.3 03/31/2022 2301   PLT 248 03/31/2022 2301   MCV 95.6 03/31/2022 2301   MCH 32.1 03/31/2022 2301   MCHC 33.5 03/31/2022 2301   RDW 12.9 03/31/2022 2301   LYMPHSABS 2.0 03/31/2022 2301   MONOABS 0.6 03/31/2022 2301   EOSABS 0.1 03/31/2022 2301   BASOSABS 0.1 03/31/2022 2301    CMP     Component Value Date/Time   NA 141 03/31/2022 2301   K 3.3 (L) 03/31/2022 2301   CL 106 03/31/2022 2301   CO2 27 03/31/2022 2301   GLUCOSE 104 (H) 03/31/2022 2301   BUN 11 03/31/2022 2301   CREATININE 0.64 03/31/2022 2301   CALCIUM 8.9 03/31/2022 2301   PROT 6.9 03/31/2022 2301   ALBUMIN 4.0 03/31/2022 2301   AST 24 03/31/2022 2301   ALT 17 03/31/2022 2301   ALKPHOS 48 03/31/2022 2301   BILITOT 1.1 03/31/2022 2301   GFRNONAA >60 03/31/2022 2301    Lipid Panel     Component Value Date/Time   CHOL 240 (H) 04/01/2022 0535   TRIG 128 04/01/2022 0535   HDL 56 04/01/2022 0535   CHOLHDL 4.3 04/01/2022 0535   VLDL 26 04/01/2022 0535   LDLCALC 158 (H) 04/01/2022 0535   LDL 158 A1c 5.2   Imaging I have reviewed the images obtained:  CT-head-no acute changes  MRI examination of the brain-small left thalamic acute infarct.  MRA head-ordered and pending  CTA head not ordered because of patient's allergy of nausea vomiting and gastric upset with iodinated contrast.  Carotid Dopplers have already been done: Focal heterogeneous plaque  at the right cervical ICA origin with 50% stenosis, ICA with 50 to 69% stenosis.  Scattered calcific plaques in the proximal cervical left ICA but no hemodynamically significant stenosis.  Bilateral antegrade vertebral artery  flow.  Assessment:  81 year old woman with past medical history of hyperlipidemia and hypertension presenting with sudden onset of right-sided numbness, noted to have a small left thalamic infarct likely small vessel etiology. 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 Left carotid has no hemodynamically significant stenosis Posterior circulation in the neck has anterograde flow Intracranial posterior circulation needs to be visualized-MRI has been ordered.  Allergic to CT dye hence CTA not being done.  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 Atorvastatin 80 for goal LDL less than 70.  She is at an LDL of 158. 2D echocardiogram PT OT Speech therapy Permissive hypertension-allow for blood pressures to be as high as up to 220 and treat only on a as needed basis if more than 220.  In the next 48 to 72 hours, start reducing the blood pressure with a goal blood pressure eventually of normotension. She will need outpatient neurology follow-up in 4 to 6 weeks after discharge. I will follow-up the remainder of the results with you as they become available.  Discussed my plan in detail with the patient and over the phone with her daughter.  I answered all my questions to the best of my ability.  I have also relayed my plan to Dr. Lyn Hollingshead  -- Milon Dikes, MD Neurologist [facility] Pager: [phone]

## 2022-04-01 NOTE — Progress Notes (Signed)
SLP Cancellation Note  Patient Details Name: Shannon [name] MRN: [id number] DOB: Jun 15, 1941   Cancelled treatment:       Reason Eval/Treat Not Completed: SLP screened, no needs identified, will sign off. SLP spoke with pt and family member who report pt at baseline with cognitive and communication function. Speech is clear without dysarthria and fluent/appropriate in mod complex conversation. Patient was educated on role of SLP and provided with provider's card for follow-up if functional deficits noted at d/c.  Rondel Baton, [place], [employer] Pathologist [phone]    Shannon [name] 04/01/2022, 3:24 PM

## 2022-04-01 NOTE — Assessment & Plan Note (Signed)
-   We will continue Vitamin B12.

## 2022-04-01 NOTE — ED Notes (Signed)
RN notified Alexander, DO of pt bradycardia.

## 2022-04-01 NOTE — ED Notes (Signed)
Pt off floor at MRI.

## 2022-04-01 NOTE — Assessment & Plan Note (Signed)
-   We will resume her CPAP nightly.

## 2022-04-01 NOTE — ED Notes (Signed)
Pt has returned from MRI. 

## 2022-04-01 NOTE — Evaluation (Signed)
Occupational Therapy Evaluation Patient Details Name: CONTINA STRAIN MRN: [id number] DOB: 1941/06/15 Today's Date: 04/01/2022   History of Present Illness 81 y.o. female past medical history of hypertension, hyperlipidemia, sleep apnea, anxiety, depression presented to the emergency room for sudden onset of right-sided numbness that started when she was getting ready for bed with last known well at 9:45 PM on 03/31/2022.  She said that she felt that her right hand and foot went numb and then the right arm was involved.  She was able to move her hand but it felt funny.  She took an aspirin and called EMS.  She also complained of a small headache at that time.  She was evaluated in the emergency room, code stroke activated and she was evaluated by the telestroke specialist, who deemed deficits too mild to treat and recommended admission for stroke work-up.  MRI revealed a left thalamic stroke.   Clinical Impression   Upon entering the room, pt supine in bed and agreeable to OT evaluation. Pt is pleasant and cooperative as well as oriented x4. Pt reports living at home alone and independently at baseline. She has family that can intermittently assist as needed. Pt performed bed mobility without physical assistance and stands without use of RW. Pt ambulates to bathroom with supervision and is able to perform hygiene and clothing management without assistance. Pt ambulates further in hallway with increased distractions and head turns L <> R. 1 LOB noted but pt self corrects without physical assistance. Pt is able to functionally utilize R UE for self care needs and demonstrated ability to open all containers and cut food with fork and knife. However, pt does continue to report numbness and pins and needles sensation in R UE and LE. Further assessment also shows sensation deficits and pt educated on safety concerns with ADLs and IADL tasks related to this with pt verbalizing understanding. OT recommends use of RW at  home for increased support with safety and energy conservation. Pt verbalized understanding and agrees. Recommendation for outpt OT intervention to address deficits further once returning home.      Recommendations for follow up therapy are one component of a multi-disciplinary discharge planning process, led by the attending physician.  Recommendations may be updated based on patient status, additional functional criteria and insurance authorization.   Follow Up Recommendations  Outpatient OT    Assistance Recommended at Discharge PRN  Patient can return home with the following Assist for transportation       Equipment Recommendations  None recommended by OT       Precautions / Restrictions Precautions Precautions: Fall Precaution Comments: moderate fall risk      Mobility Bed Mobility Overal bed mobility: Modified Independent             General bed mobility comments: no physical assistance but HOB elevated    Transfers Overall transfer level: Needs assistance   Transfers: Sit to/from Stand, Bed to chair/wheelchair/BSC Sit to Stand: Modified independent (Device/Increase time)     Step pivot transfers: Supervision            Balance Overall balance assessment: Needs assistance Sitting-balance support: Feet supported Sitting balance-Leahy Scale: Good Sitting balance - Comments: on commode and EOB   Standing balance support: During functional activity, No upper extremity supported Standing balance-Leahy Scale: Good Standing balance comment: 1 LOB with head turns while ambulation but pt able to self correct without physical assistance  ADL either performed or assessed with clinical judgement   ADL Overall ADL's : Needs assistance/impaired                                       General ADL Comments: supervision for self care needs and functional mobility without use of AD     Vision Patient Visual  Report: No change from baseline              Pertinent Vitals/Pain Pain Assessment Pain Assessment: No/denies pain     Hand Dominance Right   Extremity/Trunk Assessment Upper Extremity Assessment Upper Extremity Assessment: RUE deficits/detail RUE Deficits / Details: sensation deficit  with pt reporting decreased temperatures on B UEs with assessment RUE Sensation: decreased proprioception   Lower Extremity Assessment Lower Extremity Assessment: RLE deficits/detail RLE Deficits / Details: reports of numbness and pins and needles sensation RLE Sensation: decreased proprioception       Communication Communication Communication: No difficulties   Cognition Arousal/Alertness: Awake/alert Behavior During Therapy: WFL for tasks assessed/performed Overall Cognitive Status: Within Functional Limits for tasks assessed                                 General Comments: very pleasant and motivated. A &Ox4                Home Living Family/patient expects to be discharged to:: Private residence Living Arrangements: Alone Available Help at Discharge: Family;Available PRN/intermittently Type of Home: Apartment Home Access: Level entry     Home Layout: One level     Bathroom Shower/Tub: Producer, television/film/video: Standard Bathroom Accessibility: No   Home Equipment: Agricultural consultant (2 wheels);BSC/3in1;Shower seat          Prior Functioning/Environment Prior Level of Function : Independent/Modified Independent;Driving             Mobility Comments: Pt reports Ind without use of AD but does furniture walk at times. ADLs Comments: Independent and has groceries delivered                 OT Goals(Current goals can be found in the care plan section) Acute Rehab OT Goals Patient Stated Goal: to go home and return to PLOF OT Goal Formulation: With patient Time For Goal Achievement: 04/01/22 Potential to Achieve Goals: Good  OT Frequency:          AM-PAC OT "6 Clicks" Daily Activity     Outcome Measure Help from another person eating meals?: None Help from another person taking care of personal grooming?: None Help from another person toileting, which includes using toliet, bedpan, or urinal?: None Help from another person bathing (including washing, rinsing, drying)?: None Help from another person to put on and taking off regular upper body clothing?: None Help from another person to put on and taking off regular lower body clothing?: None 6 Click Score: 24   End of Session Nurse Communication: Mobility status  Activity Tolerance: Patient tolerated treatment well Patient left: in bed;with call bell/phone within reach  OT Visit Diagnosis: Unsteadiness on feet (R26.81);Other (comment) (sensation deficits)                Time: 5102-5852 OT Time Calculation (min): 39 min Charges:  OT General Charges $OT Visit: 1 Visit OT Evaluation $OT Eval Low Complexity: 1 Low OT Treatments $Self  Care/Home Management : 8-22 mins  Jackquline Denmark, MS, OTR/L , CBIS ascom [phone]  04/01/22, 11:10 AM

## 2022-04-01 NOTE — Progress Notes (Signed)
PHARMACIST - PHYSICIAN ORDER COMMUNICATION  CONCERNING: P&T Medication Policy on Herbal Medications  DESCRIPTION:  This patient's order(s) for: Apoaequorin Caps has been noted.  This product(s) is classified as an "herbal" or natural product. Due to a lack of definitive safety studies or FDA approval, nonstandard manufacturing practices, plus the potential risk of unknown drug-drug interactions while on inpatient medications, the Pharmacy and Therapeutics Committee does not permit the use of "herbal" or natural products of this type within [facility].   ACTION TAKEN: The pharmacy department is unable to verify this order at this time.  Please reevaluate patient's clinical condition at discharge and address if the herbal or natural product(s) should be resumed at that time.  Otelia Sergeant, PharmD, MBA 04/01/2022 2:00 AM

## 2022-04-01 NOTE — Progress Notes (Signed)
*  PRELIMINARY RESULTS* Echocardiogram 2D Echocardiogram has been performed.  Shannon [name] 04/01/2022, 11:01 AM

## 2022-04-01 NOTE — Progress Notes (Signed)
PT Cancellation Note  Patient Details Name: Shannon [name] MRN: [id number] DOB: 1941-02-17   Cancelled Treatment:    Reason Eval/Treat Not Completed:  (Consult received and chart reviewed.  Patient currently declining PT evaluation. Per OT, mobility assessed and appropriate education provided during OT evaulation to ensure patient safety.  Will follow remotely and initiate as patient allows.  Would benefit from outpatient therapy referral as appropriate.)   Jefferey Lippmann H. Manson Passey, PT, DPT, NCS 04/01/22, 10:34 AM [phone]

## 2022-04-01 NOTE — Progress Notes (Signed)
Dr Lyn Hollingshead and RN Debi made aware that tele monitoring reports 9 beat run of Vtach

## 2022-04-01 NOTE — Consult Note (Signed)
[facility] Tele[facility]urology Consult Services   Patient Name:   Shannon [name], Shannon [name] Date of Birth:   12/11/1940 Identification Number:   MRN - [id number] Date of Service:   03/31/2022 23:08:51  Diagnosis:       R20.2 - Paresthesia of skin  Impression:      81 years old woman with history of HTN presents c/o paresthesias to the right hand and right foot. Patient was noticed with a left facial droop on arrival to the ED for an initial NIHSS is 2. On my evaluation, there's some minimal facial drooping to the left that corrects when she smiles. Upon asking her daughter at bedside, she reports her mother looks no different from usual. Also on exam, there's decreased sensation to the right side of the face and RLE. NCCT showed no acute abnormalities. Possible small vessel stroke involving the left thalamus area. Recommend to admit for further work-up.  Our recommendations are outlined below.  Recommendations:        Stroke/Telemetry Floor       Neuro Checks       Bedside Swallow Eval       DVT Prophylaxis       IV Fluids, Normal Saline       Head of Bed 30 Degrees       Euglycemia and Avoid Hyperthermia (PRN Acetaminophen)       Initiate or continue Aspirin 325 MG daily       Antihypertensives PRN if Blood pressure is greater than 220/120 or there is a concern for End organ damage/contraindications for permissive HTN. If blood pressure is greater than 220/120 give labetalol PO or IV or Vasotec IV with a goal of 15% reduction in BP during the first 24 hours.  Per facility request will defer further work up, management, and referrals to inpatient service, inclusive of inpatient neurology consult.  Sign Out:       Discussed with Emergency Department Provider    ------------------------------------------------------------------------------  Advanced Imaging: Advanced Imaging Deferred because:  Non-disabling symptoms as verified by the patient; no cortical signs so not  consistent with LVO   Metrics: Last Known Well: 03/31/2022 21:45:00 [facility] Notification Time: 03/31/2022 23:08:50 Arrival Time: 03/31/2022 22:51:00 Stamp Time: 03/31/2022 23:08:51 Initial Response Time: 03/31/2022 23:10:00 Symptoms: Right sided numbness. Initial patient interaction: 03/31/2022 23:31:21 NIHSS Assessment Completed: 03/31/2022 23:36:12 Patient is not a candidate for Thrombolytic. Thrombolytic Medical Decision: 03/31/2022 23:36:13 Patient was not deemed candidate for Thrombolytic because of following reasons: No disabling symptoms.  CT head showed no acute hemorrhage or acute core infarct.  Primary Provider Notified of Diagnostic Impression and Management Plan on: 03/31/2022 23:49:52    ------------------------------------------------------------------------------  History of Present Illness: Patient is a 81 year old Female.  Patient was brought by EMS for symptoms of Right sided numbness. Patient is a right handed woman with history of HTN who presents to the ED complaining of right sided numbness that started while getting ready for bed at 21:45. Patient reports she felt her right hand and right foot going numb. She states her right hand "felt like a feather". She was able to open and close her hand at the time. She took an aspirin 325 mg and called EMS who brought her to ER for evaluation. While at the ED, patient complained of headache and she was noticed with a left facial droop.     Past Medical History:      Hypertension [place] PMH:  hip surgery July 2023  Medications:  No Anticoagulant use  No Antiplatelet use Reviewed EMR for current medications  Allergies:  Reviewed  Social History: Smoking: No Alcohol Use: No  Family History:  There is no family history of premature cerebrovascular disease pertinent to this consultation  ROS : 14 Points Review of Systems was performed and was negative except mentioned in HPI.  Past Surgical  History: There Is No Surgical History Contributory To Today's Visit     Examination: BP(163/99), Pulse(65), Blood Glucose(98) 1A: Level of Consciousness - Alert; keenly responsive + 0 1B: Ask Month and Age - Both Questions Right + 0 1C: Blink Eyes & Squeeze Hands - Performs Both Tasks + 0 2: Test Horizontal Extraocular Movements - Normal + 0 3: Test Visual Fields - No Visual Loss + 0 4: Test Facial Palsy (Use Grimace if Obtunded) - Normal symmetry + 0 5A: Test Left Arm Motor Drift - No Drift for 10 Seconds + 0 5B: Test Right Arm Motor Drift - No Drift for 10 Seconds + 0 6A: Test Left Leg Motor Drift - No Drift for 5 Seconds + 0 6B: Test Right Leg Motor Drift - No Drift for 5 Seconds + 0 7: Test Limb Ataxia (FNF/Heel-Shin) - No Ataxia + 0 8: Test Sensation - Mild-Moderate Loss: Can Sense Being Touched + 1 9: Test Language/Aphasia - Normal; No aphasia + 0 10: Test Dysarthria - Normal + 0 11: Test Extinction/Inattention - No abnormality + 0  NIHSS Score: 1   Pre-Morbid Modified Rankin Scale: 0 Points = No symptoms at all  Spoke with : ED Physician  Patient/Family was informed the Neurology Consult would occur via TeleHealth consult by way of interactive audio and video telecommunications and consented to receiving care in this manner.   Patient is being evaluated for possible acute neurologic impairment and high probability of imminent or life-threatening deterioration. I spent total of 35 minutes providing care to this patient, including time for face to face visit via telemedicine, review of medical records, imaging studies and discussion of findings with providers, the patient and/or family.   Dr Vira Agar   [facility] For Inpatient follow-up with [facility] physician please call [phone]. This is not an outpatient service. Post hospital discharge, please contact hospital directly.

## 2022-04-01 NOTE — Assessment & Plan Note (Addendum)
-   We will continue her antihypertensives with permissive parameters.

## 2022-04-01 NOTE — Progress Notes (Signed)
PROGRESS NOTE    Shannon [name]   RSW:[id number] DOB: [birth year]  DOA: 03/31/2022 Date of Service: 04/01/22 PCP: Ethelda Chick, MD     Brief Narrative / Hospital Course:  Shannon [name] is a 81 y.o. Caucasian female with medical history significant for anxiety, depression, GERD, essential hypertension, dyslipidemia and OSA on CPAP, who presented to the emergency room with acute onset of right-sided upper and lower extremity numbness with associated headache that started earlier this evening at 9:45 PM.  Her headache is resolved and it was brief but her numbness continued.  She denied focal muscle weakness, dysphagia or dysarthria.  No facial droop.  No tinnitus or vertigo.  09/06: BP 147/55 with heart rate of 59 with otherwise normal vital signs.  Labs revealed a potassium of 3.3 and otherwise unremarkable CMP and CBC.  Urinalysis came back negative.  Urine drug screen came back negative.  Influenza antigens and COVID-19 PCR came back negative. EKG showed sinus rhythm with a rate of 60.  MRI brain acute small left thalamic infarction.  Started aspirin/Plavix.  Pending carotid Doppler, 2D echo.  Patient declines statin. 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  Echocardiogram pending. Neurology recommendations: MRA pending. Aspirin 81 Plavix 75 - duration to be determined after vessel imaging completion.     Consultants:  Neurology   Procedures: none      ASSESSMENT & PLAN:   Principal Problem:   Right thalamic infarction [facility]) Active Problems:   Essential hypertension   OSA on CPAP   Vitamin B12 deficiency   Carotid atherosclerosis, bilateral   Internal carotid artery stenosis, right  Right thalamic infarction (HCC) R Cervical ICA atherosclerosis c/w 50-69% stenosis  neuro checks q.4 hours for 24 hours.   aspirin and Plavix.  Declined statin.   carotid Doppler: (+) focal heterogeneous plaque at the right cervical  ICA origin, approx 50% origin stenosis. The above flow velocities and ICA/CCA ratio of 2.2 are both consistent with a degree of stenosis of 50-69%. Scattered calcific plaques in the proximal cervical left ICA, but visually are nonstenosing with flow velocities and ICA/CCA ratio compatible with no hemodynamically significant (50% or less) stenosis. Bilateral antegrade vertebral artery flow. 2D echo with bubble study pending MRA pending - will determine Plavix/ASA duration Lipids: LDL 158 --> consider PCSK9 inhibitor outpatient  neurology consultation as well as physical/occupation/speech therapy consults  Essential hypertension Antihypertensives PRN >220/120 or there is a concern for end organ damage/contraindications for permissive HTN. If blood pressure is greater than 220/120 give labetalol PO or IV or Vasotec IV with a goal of 15% reduction in BP during the first 24 hours. continue her antihypertensives with permissive parameters.  Bradycardia, nocturnal and asymptomatic Continue telemetry TSH  Vitamin B12 deficiency continue Vitamin B12.  OSA on CPAP CPAP nightly.        DVT prophylaxis: Lovenox Pertinent IV fluids/nutrition: continuous IVF d/c this morning, taking po after passed swallow eval  Central lines / invasive devices: n/a  Code Status: FULL Family Communication: neurology spoke w/ family   Disposition: inpatient, anticipate d/c back home TOC needs: none at this time, pend PT/OT eval Barriers to discharge / significant pending items: Neuro recs, Echo/MRA results             Subjective:  Patient reports doing well, numbness is improving but not resolved. Can feel temperature in her hand again which she is reassured by. No confusion, no headache.  Objective:  Vitals:   04/01/22 0623 04/01/22 0900 04/01/22 1200 04/01/22 1405  BP:  (!) 140/72 138/88 (!) 139/55  Pulse:  (!) 55 (!) 58 (!) 54  Resp: 14 16 18 20   Temp:  98.2 F (36.8 C) 98.4  F (36.9 C) 98.3 F (36.8 C)  TempSrc:  Oral Oral   SpO2:  100% 100% 96%  Weight:      Height:       No intake or output data in the 24 hours ending 04/01/22 1412 Filed Weights   03/31/22 2313 03/31/22 2314  Weight: 68.5 kg 71.4 kg    Examination:  Constitutional:  VS as above General Appearance: alert, well-developed, well-nourished, NAD Eyes: Normal lids and conjunctive, non-icteric sclera Ears, Nose, Mouth, Throat: Normal external appearance MMM Neck: No masses, trachea midline Respiratory: Normal respiratory effort Musculoskeletal:  No clubbing/cyanosis of digits Symmetrical movement in all extremities Neurological: No cranial nerve deficit on limited exam Alert Psychiatric: Normal judgment/insight Normal mood and affect       Scheduled Medications:   [START ON 04/02/2022]  stroke: early stages of recovery book   Does not apply Once   acetaminophen  1,000 mg Oral Once   aspirin EC  81 mg Oral Daily   clopidogrel  75 mg Oral Daily   cyanocobalamin  1,000 mcg Oral Daily   enoxaparin (LOVENOX) injection  40 mg Subcutaneous Q24H   hydrochlorothiazide  12.5 mg Oral Daily   losartan  100 mg Oral Daily   montelukast  10 mg Oral QHS   pantoprazole  40 mg Oral QODAY    Continuous Infusions:   PRN Medications:  acetaminophen **OR** acetaminophen (TYLENOL) oral liquid 160 mg/5 mL **OR** acetaminophen, HYDROcodone-acetaminophen, methocarbamol, ondansetron (ZOFRAN) IV, senna-docusate, traZODone  Antimicrobials:  Anti-infectives (From admission, onward)    None       Data Reviewed: I have personally reviewed following labs and imaging studies  CBC: Recent Labs  Lab 03/31/22 2301  WBC 6.5  NEUTROABS 3.7  HGB 12.5  HCT 37.3  MCV 95.6  PLT 248   Basic Metabolic Panel: Recent Labs  Lab 03/31/22 2301 04/01/22 0535  NA 141  --   K 3.3*  --   CL 106  --   CO2 27  --   GLUCOSE 104*  --   BUN 11  --   CREATININE 0.64  --   CALCIUM 8.9  --   MG   --  1.8   GFR: Estimated Creatinine Clearance: 51.4 mL/min (by C-G formula based on SCr of 0.64 mg/dL). Liver Function Tests: Recent Labs  Lab 03/31/22 2301  AST 24  ALT 17  ALKPHOS 48  BILITOT 1.1  PROT 6.9  ALBUMIN 4.0   No results for input(s): "LIPASE", "AMYLASE" in the last 168 hours. No results for input(s): "AMMONIA" in the last 168 hours. Coagulation Profile: Recent Labs  Lab 03/31/22 2301  INR 1.1   Cardiac Enzymes: No results for input(s): "CKTOTAL", "CKMB", "CKMBINDEX", "TROPONINI" in the last 168 hours. BNP (last 3 results) No results for input(s): "PROBNP" in the last 8760 hours. HbA1C: Recent Labs    03/31/22 2301  HGBA1C 5.2   CBG: Recent Labs  Lab 03/31/22 2302  GLUCAP 98   Lipid Profile: Recent Labs    04/01/22 0535  CHOL 240*  HDL 56  LDLCALC 158*  TRIG 128  CHOLHDL 4.3   Thyroid Function Tests: No results for input(s): "TSH", "T4TOTAL", "FREET4", "T3FREE", "THYROIDAB" in the last 72 hours. Anemia  Panel: No results for input(s): "VITAMINB12", "FOLATE", "FERRITIN", "TIBC", "IRON", "RETICCTPCT" in the last 72 hours. Urine analysis:    Component Value Date/Time   COLORURINE STRAW (A) 03/31/2022 2330   APPEARANCEUR CLEAR (A) 03/31/2022 2330   LABSPEC 1.005 03/31/2022 2330   PHURINE 6.0 03/31/2022 2330   GLUCOSEU NEGATIVE 03/31/2022 2330   HGBUR NEGATIVE 03/31/2022 2330   BILIRUBINUR NEGATIVE 03/31/2022 2330   KETONESUR NEGATIVE 03/31/2022 2330   PROTEINUR NEGATIVE 03/31/2022 2330   NITRITE NEGATIVE 03/31/2022 2330   LEUKOCYTESUR NEGATIVE 03/31/2022 2330   Sepsis Labs: @LABRCNTIP (procalcitonin:4,lacticidven:4)  Recent Results (from the past 240 hour(s))  Resp Panel by RT-PCR (Flu A&B, Covid) Anterior Nasal Swab     Status: None   Collection Time: 03/31/22 11:30 PM   Specimen: Anterior Nasal Swab  Result Value Ref Range Status   SARS Coronavirus 2 by RT PCR NEGATIVE NEGATIVE Final    Comment: (NOTE) SARS-CoV-2 target nucleic  acids are NOT DETECTED.  The SARS-CoV-2 RNA is generally detectable in upper respiratory specimens during the acute phase of infection. The lowest concentration of SARS-CoV-2 viral copies this assay can detect is 138 copies/mL. A negative result does not preclude SARS-Cov-2 infection and should not be used as the sole basis for treatment or other patient management decisions. A negative result may occur with  improper specimen collection/handling, submission of specimen other than nasopharyngeal swab, presence of viral mutation(s) within the areas targeted by this assay, and inadequate number of viral copies(<138 copies/mL). A negative result must be combined with clinical observations, patient history, and epidemiological information. The expected result is Negative.  Fact Sheet for Patients:  EntrepreneurPulse.com.au  Fact Sheet for Healthcare Providers:  IncredibleEmployment.be  This test is no t yet approved or cleared by the [place] FDA and  has been authorized for detection and/or diagnosis of SARS-CoV-2 by FDA under an Emergency Use Authorization (EUA). This EUA will remain  in effect (meaning this test can be used) for the duration of the COVID-19 declaration under Section 564(b)(1) of the Act, 21 U.S.C.section 360bbb-3(b)(1), unless the authorization is terminated  or revoked sooner.       Influenza A by PCR NEGATIVE NEGATIVE Final   Influenza B by PCR NEGATIVE NEGATIVE Final    Comment: (NOTE) The Xpert Xpress SARS-CoV-2/FLU/RSV plus assay is intended as an aid in the diagnosis of influenza from Nasopharyngeal swab specimens and should not be used as a sole basis for treatment. Nasal washings and aspirates are unacceptable for Xpert Xpress SARS-CoV-2/FLU/RSV testing.  Fact Sheet for Patients: EntrepreneurPulse.com.au  Fact Sheet for Healthcare Providers: IncredibleEmployment.be  This  test is not yet approved or cleared by the [place] FDA and has been authorized for detection and/or diagnosis of SARS-CoV-2 by FDA under an Emergency Use Authorization (EUA). This EUA will remain in effect (meaning this test can be used) for the duration of the COVID-19 declaration under Section 564(b)(1) of the Act, 21 U.S.C. section 360bbb-3(b)(1), unless the authorization is terminated or revoked.  Performed at [facility], [address] [place], [place] [address]          Radiology Studies: MR ANGIO HEAD WO CONTRAST  Result Date: 04/01/2022 CLINICAL DATA:  Stroke follow-up EXAM: MRA HEAD WITHOUT CONTRAST TECHNIQUE: Angiographic images of the Circle of Willis were acquired using MRA technique without intravenous contrast. COMPARISON:  Brain MRI from earlier today FINDINGS: Anterior circulation: Carotid arteries are widely patent where covered. No major branch occlusion, beading, or aneurysm. Proximal left M2 branches appear  diminutive but these are all seen at the same level just below a probable slab interface and are favored artifactual. No ischemia in this area on prior brain MRI. Posterior circulation: Vertebral and basilar arteries are smoothly contoured and widely patent. Dominant right PICA. IMPRESSION: 1. No vascular abnormality underlying the left thalamic infarct. 2. Narrow area of left MCA branch narrowings which are favored artifactual. No ischemia in this distribution by MRI. Electronically Signed   By: Jorje Guild M.D.   On: 04/01/2022 12:38   US Carotid Bilateral (at [facility] and [facility] only)  Result Date: 04/01/2022 CLINICAL DATA:  Small acute left thalamic infarct. Check for carotid embolic source. EXAM: BILATERAL CAROTID DUPLEX ULTRASOUND TECHNIQUE: Pearline Cables scale imaging, color Doppler and duplex ultrasound were performed of bilateral carotid and vertebral arteries in the neck. COMPARISON:  None Available. FINDINGS: Criteria: Quantification of carotid stenosis  is based on velocity parameters that correlate the residual internal carotid diameter with NASCET-based stenosis levels, using the diameter of the distal internal carotid lumen as the denominator for stenosis measurement. The following velocity measurements were obtained: RIGHT ICA: 132 cm/sec CCA: 66 cm/sec SYSTOLIC ICA/CCA RATIO:  2.2 ECA: 76 cm/sec LEFT ICA: 78 cm/sec CCA: 52 cm/sec SYSTOLIC ICA/CCA RATIO:  1.5 ECA: 69 cm/sec RIGHT CAROTID ARTERY: There is a focal heterogeneous mixed plaque deposit of the posterior wall of the cervical ICA origin, visually causing approximately 50% luminal stenosis. No other focal cervical ICA stenosis is seen. There are trace plaques at the ECA origin. The common carotid artery is widely patent. RIGHT VERTEBRAL ARTERY: There is antegrade flow with PSV 56.1 centimeter/second. LEFT CAROTID ARTERY: There are small scattered nonstenosing echogenic calcific plaques along the wall of the proximal cervical ICA. The more distal artery is clear. The common carotid artery is widely patent. LEFT VERTEBRAL ARTERY: There is antegrade flow with PSV 46.9 centimeter/second. Upper extremity blood pressures: Not obtained. IMPRESSION: 1. There is focal heterogeneous plaque at the right cervical ICA origin, visually causing approximately 50% origin stenosis. The above flow velocities and ICA/CCA ratio of 2.2 are both consistent with a degree of stenosis of 50-69%. 2. Scattered calcific plaques in the proximal cervical left ICA, but visually are nonstenosing with flow velocities and ICA/CCA ratio compatible with no hemodynamically significant (50% or less) stenosis. 3. Bilateral antegrade vertebral artery flow. Electronically Signed   By: Telford Nab M.D.   On: 04/01/2022 03:39   MR BRAIN WO CONTRAST  Result Date: 04/01/2022 CLINICAL DATA:  Sudden onset numbness to face and entire right side of body; mild left facial droop EXAM: MRI HEAD WITHOUT CONTRAST TECHNIQUE: Multiplanar, multiecho pulse  sequences of the brain and surrounding structures were obtained without intravenous contrast. COMPARISON:  No prior MRI, correlation is made with CT head 03/31/2022 FINDINGS: Brain: 6 mm focus of restricted diffusion in the left thalamus (series 5, image 22), with ADC correlate (series 6, image 22). No acute hemorrhage, mass, mass effect, or midline shift. No hydrocephalus or extra-axial collection. No hemosiderin deposition to suggest remote hemorrhage. Scattered T2 hyperintense signal in the periventricular white matter, likely the sequela of mild chronic small vessel ischemic disease. Vascular: Normal arterial flow voids. Skull and upper cervical spine: Normal marrow signal. Sinuses/Orbits: No acute finding. Status post bilateral lens replacements. Other: The mastoids are well aerated. 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 No additional acute intracranial process. These results were called by telephone at the time of interpretation on 04/01/2022 at 2:01 am to provider [facility] ,  who verbally acknowledged these results. Electronically Signed   By: Merilyn Baba M.D.   On: 04/01/2022 02:01            LOS: 0 days      Emeterio Reeve, DO [facility] 04/01/2022, 2:12 PM   Staff may message me via secure chat in [place]  but this may not receive immediate response,  please page for urgent matters!  If 7PM-7AM, please contact night-coverage [email or web address]  Dictation software was used to generate the above note. Typos may occur and escape review, as with typed/written notes. Please contact Dr Sheppard Coil directly for clarity if needed.

## 2022-04-01 NOTE — ED Notes (Signed)
US at bedside

## 2022-04-02 DIAGNOSIS — I6381 Other cerebral infarction due to occlusion or stenosis of small artery: Secondary | ICD-10-CM | POA: Diagnosis not present

## 2022-04-02 LAB — BASIC METABOLIC PANEL
Anion gap: 6 (ref 5–15)
BUN: 11 mg/dL (ref 8–23)
CO2: 26 mmol/L (ref 22–32)
Calcium: 9.3 mg/dL (ref 8.9–10.3)
Chloride: 109 mmol/L (ref 98–111)
Creatinine, Ser: 0.61 mg/dL (ref 0.44–1.00)
GFR, Estimated: >60 mL/min (ref >60)
Glucose, Bld: 99 mg/dL (ref 70–99)
Potassium: 3.7 mmol/L (ref 3.5–5.1)
Sodium: 141 mmol/L (ref 135–145)

## 2022-04-02 MED ORDER — CLOPIDOGREL BISULFATE 75 MG PO TABS: 75.0000 mg | ORAL_TABLET | Freq: Every day | ORAL | 0 refills | Status: AC

## 2022-04-02 NOTE — Hospital Course (Signed)
Shannon [name] is a 81 y.o. Caucasian female with medical history significant for anxiety, depression, GERD, essential hypertension, dyslipidemia and OSA on CPAP, who presented to the emergency room with acute onset of right-sided upper and lower extremity numbness with associated headache that started earlier this evening at 9:45 PM.  Her headache is resolved and it was brief but her numbness continued.  She denied focal muscle weakness, dysphagia or dysarthria.  No facial droop.  No tinnitus or vertigo.  09/06: BP 147/55 with heart rate of 59 with otherwise normal vital signs.  Labs revealed a potassium of 3.3 and otherwise unremarkable CMP and CBC.  Urinalysis came back negative.  Urine drug screen came back negative.  Influenza antigens and COVID-19 PCR came back negative. EKG showed sinus rhythm with a rate of 60.  MRI brain acute small left thalamic infarction.  Started aspirin/Plavix.  Pending carotid Doppler, 2D echo.  Patient declines statin. 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  Echocardiogram pending. Neurology recommendations: MRA pending. Aspirin 81 Plavix 75 - duration to be determined after vessel imaging completion.        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   Initiated aspirin and Plavix.  Declined statin.   carotid Doppler: (+) focal heterogeneous plaque at the right cervical ICA origin, approx 50% origin stenosis. The above flow velocities and ICA/CCA ratio of 2.2 are both consistent with a degree of stenosis of 50-69%. Scattered calcific  plaques in the proximal cervical left ICA, but visually are nonstenosing with flow velocities and ICA/CCA ratio compatible with no hemodynamically significant (50% or less) stenosis. Bilateral antegrade vertebral artery flow. 2D echo with bubble study - LVEF 55 to 60%, no RWMA, grade 1 diastolic dysfunction, no shunt, no evidence for cardioembolic cause of stroke MRA -no vascular abnormality underlying left thalamic infarct, narrow L MCA branch, favored artifactual, no ischemia in this distribution.   Pending neurology recommendations regarding Plavix/ASA duration Lipids: LDL 158 --> consider PCSK9 inhibitor outpatient  neurology consultation as well as physical/occupation/speech therapy consults   Essential hypertension Antihypertensives PRN >220/120 or there is a concern for end organ damage/contraindications for permissive HTN. If blood pressure is greater than 220/120 give labetalol PO or IV or Vasotec IV with a goal of 15% reduction in BP during the first 24 hours. continue her antihypertensives with permissive parameters.   Bradycardia, nocturnal and asymptomatic Continue telemetry TSH   Vitamin B12 deficiency continue Vitamin B12.   OSA on CPAP CPAP nightly.               DVT prophylaxis: Lovenox Pertinent IV fluids/nutrition: continuous IVF d/c this morning, taking po after passed swallow eval  Central lines / invasive devices: n/a   Code Status: FULL Family Communication: ***   Disposition: inpatient, anticipate d/c back home TOC needs: none at this time, pend PT/OT eval Barriers to discharge / significant pending items: Neuro recs, Echo/MRA results

## 2022-04-02 NOTE — Plan of Care (Signed)

## 2022-04-02 NOTE — Progress Notes (Signed)
Pt discharging home with family. PIV and tele removed. Discharge packet reviewed with patient, verbalizes understanding.

## 2022-04-02 NOTE — Discharge Summary (Signed)
Physician Discharge Summary   Patient: Shannon [name] MRN: [id number]  DOB: September 17, 1940   Admit:     Date of Admission: 03/31/2022 Admitted from: home   Discharge: Date of discharge: 04/02/22 Disposition: Home Condition at discharge: good  CODE STATUS: FULL     Discharge Physician: Sunnie Nielsen, DO [facility]     PCP: Ethelda Chick, MD  Recommendations for Outpatient Follow-up:  Follow up with PCP Ethelda Chick, MD in 1-2 weeks See below for instructions regarding follow-up with neurology/cardiology PCP AND OTHER OUTPATIENT PROVIDERS: SEE BELOW FOR SPECIFIC DISCHARGE INSTRUCTIONS PRINTED FOR PATIENT IN ADDITION TO GENERIC AVS PATIENT INFO   Discharge Instructions     Ambulatory referral to Cardiology   Complete by: As directed    [facility] Cardiology - lipid management   Ambulatory referral to Neurology   Complete by: As directed    [facility] - stroke follow-up   Diet - low sodium heart healthy   Complete by: As directed    Discharge instructions   Complete by: As directed    Plan to follow-up outpatient with neurology in 4 to 6 weeks, cardiology as well to help manage cholesterol which will help prevent stroke and other cardiovascular complications.  See your discharge summary for contact information for [facility] clinic to establish with neurologist and cardiologist.  Referral has been placed but please call them if you do not hear back about making an appointment sometime in the next few business days.  New medications: Aspirin plus Plavix/clopidogrel for 3 weeks total.  See printed prescription for Plavix/clopidogrel.  After this, continue aspirin 81 mg daily indefinitely.   Increase activity slowly   Complete by: As directed          Discharge Diagnoses: Principal Problem:   Right thalamic infarction [facility]) Active Problems:   Essential hypertension   OSA on CPAP   Vitamin B12 deficiency   Carotid atherosclerosis, bilateral    Internal carotid artery stenosis, right       Hospital Course: [name] is a 81 y.o. Caucasian female with medical history significant for anxiety, depression, GERD, essential hypertension, dyslipidemia and OSA on CPAP, who presented to the emergency room with acute onset of right-sided upper and lower extremity numbness with associated headache that started earlier this evening at 9:45 PM.  Her headache is resolved and it was brief but her numbness continued.  She denied focal muscle weakness, dysphagia or dysarthria.  No facial droop.  No tinnitus or vertigo.  09/06: BP 147/55 with heart rate of 59 with otherwise normal vital signs.  Labs revealed a potassium of 3.3 and otherwise unremarkable CMP and CBC.  Urinalysis came back negative.  Urine drug screen came back negative.  Influenza antigens and COVID-19 PCR came back negative. EKG showed sinus rhythm with a rate of 60.  MRI brain acute small left thalamic infarction.  Started aspirin/Plavix.  Pending carotid Doppler, 2D echo.  Patient declines statin. 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  Echocardiogram pending. Neurology recommendations: MRA pending.  09/08: Aspirin 81 Plavix 75  x3 weeks then ASA only. Refer to outpatient cardiology/lipid clinic. BP goal - normotoension. Will need outpatient neurology follow-up in 4 to 6 weeks after discharge. [name] at [facility] neurology.     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   Initiated aspirin and Plavix - both x3 weeks then ASA only.  Declined  statin. Referred to cardiology for lipid control.  carotid Doppler: (+) focal heterogeneous plaque at the right cervical ICA origin, approx 50% origin stenosis. The above flow velocities and ICA/CCA ratio of 2.2 are both consistent with a degree of stenosis of 50-69%. Scattered calcific plaques in the proximal cervical left ICA, but visually are nonstenosing with flow velocities and ICA/CCA ratio compatible with no hemodynamically significant (50% or less) stenosis. Bilateral antegrade vertebral artery flow. 2D echo with bubble study - LVEF 55 to 60%, no RWMA, grade 1 diastolic dysfunction, no shunt, no evidence for cardioembolic cause of stroke MRA -no vascular abnormality underlying left thalamic infarct, narrow L MCA branch, favored artifactual, no ischemia in this distribution.   Pending neurology recommendations regarding Plavix/ASA duration Lipids: LDL 158 --> consider PCSK9 inhibitor outpatient  neurology consultation as well as physical/occupation/speech therapy consults   Essential hypertension Antihypertensives PRN >220/120 or there is a concern for end organ damage/contraindications for permissive HTN. If blood pressure is greater than 220/120 give labetalol PO or IV or Vasotec IV with a goal of 15% reduction in BP during the first 24 hours. continue her antihypertensives with permissive parameters.   Bradycardia, nocturnal and asymptomatic Continue telemetry TSH   Vitamin B12 deficiency continue Vitamin B12.   OSA on CPAP CPAP nightly.                        Discharge Instructions  Allergies as of 04/02/2022       Reactions   Augmentin [amoxicillin-pot Clavulanate]    Calcium-containing Compounds Nausea Only   Ciprofloxacin Nausea Only   Clams [shellfish Allergy] Diarrhea, Nausea Only   Contrast Media [iodinated Contrast Media]    Flagyl [metronidazole]    Statins    Tape    Adhesive tape-silicones        Medication List     STOP taking these  medications    [employer] B12 GUMMIES PO   docusate sodium 100 MG capsule Commonly known as: COLACE   HYDROcodone-acetaminophen 5-325 MG tablet Commonly known as: NORCO/VICODIN   hydrocortisone 25 MG suppository Commonly known as: ANUSOL-HC   meloxicam 15 MG tablet Commonly known as: MOBIC   methocarbamol 750 MG tablet Commonly known as: Robaxin-750   PRENATAL ONE DAILY PO   Prevagen Extra Strength 20 MG Caps Generic drug: Apoaequorin   valACYclovir 1000 MG tablet Commonly known as: VALTREX       TAKE these medications    aspirin 81 MG chewable tablet Chew 1 tablet (81 mg total) by mouth 2 (two) times daily.   clopidogrel 75 MG tablet Commonly known as: PLAVIX Take 1 tablet (75 mg total) by mouth daily for 21 days. Start taking on: April 03, 2022   fluticasone 50 MCG/ACT nasal spray Commonly known as: FLONASE Place 1 spray into both nostrils at bedtime as needed for allergies.   hydrochlorothiazide 12.5 MG tablet Commonly known as: HYDRODIURIL Take 12.5 mg by mouth daily.   hydrocortisone 2.5 % cream Apply 1 Application topically 2 (two) times daily as needed (prn).   ketotifen 0.025 % ophthalmic  solution Commonly known as: ZADITOR Place 1 drop into both eyes as needed (prn).   losartan 100 MG tablet Commonly known as: COZAAR Take 100 mg by mouth daily.   montelukast 10 MG tablet Commonly known as: SINGULAIR Take 10 mg by mouth at bedtime.   pantoprazole 40 MG tablet Commonly known as: PROTONIX Take 40 mg by mouth daily.   PRENATAL GUMMIES PO Take 1 tablet by mouth daily.   TYLENOL ARTHRITIS PAIN PO Take 650 tablets by mouth as needed (arthritis pain).         Follow-up Information     Ethelda Chick, MD. Schedule an appointment as soon as possible for a visit.   Specialty: Family Medicine Why: Appointment for hospital follow-up Contact information: [address] [place] [address] [phone]         [facility] Schedule an appointment as soon as possible for a visit.   Why: 1. Neurology: Stroke follow-up 4 to 6 weeks after hospital discharge 2. Cardiology: cholesterol management soonest available routine appointment Contact information: [address] [place] [address] [phone]                 Allergies  Allergen Reactions   Augmentin [Amoxicillin-Pot Clavulanate]    Calcium-Containing Compounds Nausea Only   Ciprofloxacin Nausea Only   Clams [Shellfish Allergy] Diarrhea and Nausea Only   Contrast Media [Iodinated Contrast Media]    Flagyl [Metronidazole]    Statins    Tape     Adhesive tape-silicones     Subjective: Pt feels great today, tired of being in bed, wants to go home!    Discharge Exam: BP (!) 167/71 (BP Location: Right Arm)   Pulse (!) 53   Temp 97.8 F (36.6 C)   Resp 17   Ht 5\' 3"  (1.6 m)   Wt 71.4 kg   SpO2 96%   BMI 27.88 kg/m  General: Pt is alert, awake, not in acute distress Cardiovascular: RRR, S1/S2 +, no rubs, no gallops Respiratory: CTA bilaterally, no wheezing, no rhonchi Abdominal: Soft, NT, ND, bowel sounds + Extremities: no edema, no cyanosis     The results of significant diagnostics from this hospitalization (including imaging, microbiology, ancillary and laboratory) are listed below for reference.     Microbiology: Recent Results (from the past 240 hour(s))  Resp Panel by RT-PCR (Flu A&B, Covid) Anterior Nasal Swab     Status: None   Collection Time: 03/31/22 11:30 PM   Specimen: Anterior Nasal Swab  Result Value Ref Range Status   SARS Coronavirus 2 by RT PCR NEGATIVE NEGATIVE Final    Comment: (NOTE) SARS-CoV-2 target nucleic acids are NOT DETECTED.  The SARS-CoV-2 RNA is generally detectable in upper respiratory specimens during the acute phase of infection. The lowest concentration of SARS-CoV-2 viral copies this assay can detect is 138 copies/mL. A negative result does not preclude  SARS-Cov-2 infection and should not be used as the sole basis for treatment or other patient management decisions. A negative result may occur with  improper specimen collection/handling, submission of specimen other than nasopharyngeal swab, presence of viral mutation(s) within the areas targeted by this assay, and inadequate number of viral copies(<138 copies/mL). A negative result must be combined with clinical observations, patient history, and epidemiological information. The expected result is Negative.  Fact Sheet for Patients:  05/31/22  Fact Sheet for Healthcare Providers:  BloggerCourse.com  This test is no t yet approved or cleared by the [place]  FDA and  has been authorized for detection and/or diagnosis of SARS-CoV-2 by FDA under an Emergency Use Authorization (EUA). This EUA will remain  in effect (meaning this test can be used) for the duration of the COVID-19 declaration under Section 564(b)(1) of the Act, 21 U.S.C.section 360bbb-3(b)(1), unless the authorization is terminated  or revoked sooner.       Influenza A by PCR NEGATIVE NEGATIVE Final   Influenza B by PCR NEGATIVE NEGATIVE Final    Comment: (NOTE) The Xpert Xpress SARS-CoV-2/FLU/RSV plus assay is intended as an aid in the diagnosis of influenza from Nasopharyngeal swab specimens and should not be used as a sole basis for treatment. Nasal washings and aspirates are unacceptable for Xpert Xpress SARS-CoV-2/FLU/RSV testing.  Fact Sheet for Patients: BloggerCourse.comhttps://www.fda.gov/media/152166/download  Fact Sheet for Healthcare Providers: SeriousBroker.ithttps://www.fda.gov/media/152162/download  This test is not yet approved or cleared by the [place] FDA and has been authorized for detection and/or diagnosis of SARS-CoV-2 by FDA under an Emergency Use Authorization (EUA). This EUA will remain in effect (meaning this test can be used) for the duration of  the COVID-19 declaration under Section 564(b)(1) of the Act, 21 U.S.C. section 360bbb-3(b)(1), unless the authorization is terminated or revoked.  Performed at [facility], [address] [place], [place] [id number]      Labs: BNP (last 3 results) No results for input(s): "BNP" in the last 8760 hours. Basic Metabolic Panel: Recent Labs  Lab 03/31/22 2301 04/01/22 0535 04/02/22 0558  NA 141  --  141  K 3.3*  --  3.7  CL 106  --  109  CO2 27  --  26  GLUCOSE 104*  --  99  BUN 11  --  11  CREATININE 0.64  --  0.61  CALCIUM 8.9  --  9.3  MG  --  1.8  --    Liver Function Tests: Recent Labs  Lab 03/31/22 2301  AST 24  ALT 17  ALKPHOS 48  BILITOT 1.1  PROT 6.9  ALBUMIN 4.0   No results for input(s): "LIPASE", "AMYLASE" in the last 168 hours. No results for input(s): "AMMONIA" in the last 168 hours. CBC: Recent Labs  Lab 03/31/22 2301  WBC 6.5  NEUTROABS 3.7  HGB 12.5  HCT 37.3  MCV 95.6  PLT 248   Cardiac Enzymes: No results for input(s): "CKTOTAL", "CKMB", "CKMBINDEX", "TROPONINI" in the last 168 hours. BNP: Invalid input(s): "POCBNP" CBG: Recent Labs  Lab 03/31/22 2302  GLUCAP 98   D-Dimer No results for input(s): "DDIMER" in the last 72 hours. Hgb A1c Recent Labs    03/31/22 2301  HGBA1C 5.2   Lipid Profile Recent Labs    04/01/22 0535  CHOL 240*  HDL 56  LDLCALC 158*  TRIG 128  CHOLHDL 4.3   Thyroid function studies Recent Labs    04/01/22 0535  TSH 4.023   Anemia work up No results for input(s): "VITAMINB12", "FOLATE", "FERRITIN", "TIBC", "IRON", "RETICCTPCT" in the last 72 hours. Urinalysis    Component Value Date/Time   COLORURINE STRAW (A) 03/31/2022 2330   APPEARANCEUR CLEAR (A) 03/31/2022 2330   LABSPEC 1.005 03/31/2022 2330   PHURINE 6.0 03/31/2022 2330   GLUCOSEU NEGATIVE 03/31/2022 2330   HGBUR NEGATIVE 03/31/2022 2330   BILIRUBINUR NEGATIVE 03/31/2022 2330   KETONESUR NEGATIVE 03/31/2022 2330    PROTEINUR NEGATIVE 03/31/2022 2330   NITRITE NEGATIVE 03/31/2022 2330   LEUKOCYTESUR NEGATIVE 03/31/2022 2330   Sepsis Labs Recent Labs  Lab 03/31/22 2301  WBC 6.5   Microbiology Recent Results (from the past 240 hour(s))  Resp Panel by RT-PCR (Flu A&B, Covid) Anterior Nasal Swab     Status: None   Collection Time: 03/31/22 11:30 PM   Specimen: Anterior Nasal Swab  Result Value Ref Range Status   SARS Coronavirus 2 by RT PCR NEGATIVE NEGATIVE Final    Comment: (NOTE) SARS-CoV-2 target nucleic acids are NOT DETECTED.  The SARS-CoV-2 RNA is generally detectable in upper respiratory specimens during the acute phase of infection. The lowest concentration of SARS-CoV-2 viral copies this assay can detect is 138 copies/mL. A negative result does not preclude SARS-Cov-2 infection and should not be used as the sole basis for treatment or other patient management decisions. A negative result may occur with  improper specimen collection/handling, submission of specimen other than nasopharyngeal swab, presence of viral mutation(s) within the areas targeted by this assay, and inadequate number of viral copies(<138 copies/mL). A negative result must be combined with clinical observations, patient history, and epidemiological information. The expected result is Negative.  Fact Sheet for Patients:  BloggerCourse.com  Fact Sheet for Healthcare Providers:  SeriousBroker.it  This test is no t yet approved or cleared by the [place] FDA and  has been authorized for detection and/or diagnosis of SARS-CoV-2 by FDA under an Emergency Use Authorization (EUA). This EUA will remain  in effect (meaning this test can be used) for the duration of the COVID-19 declaration under Section 564(b)(1) of the Act, 21 U.S.C.section 360bbb-3(b)(1), unless the authorization is terminated  or revoked sooner.       Influenza A by PCR NEGATIVE NEGATIVE  Final   Influenza B by PCR NEGATIVE NEGATIVE Final    Comment: (NOTE) The Xpert Xpress SARS-CoV-2/FLU/RSV plus assay is intended as an aid in the diagnosis of influenza from Nasopharyngeal swab specimens and should not be used as a sole basis for treatment. Nasal washings and aspirates are unacceptable for Xpert Xpress SARS-CoV-2/FLU/RSV testing.  Fact Sheet for Patients: BloggerCourse.com  Fact Sheet for Healthcare Providers: SeriousBroker.it  This test is not yet approved or cleared by the [place] FDA and has been authorized for detection and/or diagnosis of SARS-CoV-2 by FDA under an Emergency Use Authorization (EUA). This EUA will remain in effect (meaning this test can be used) for the duration of the COVID-19 declaration under Section 564(b)(1) of the Act, 21 U.S.C. section 360bbb-3(b)(1), unless the authorization is terminated or revoked.  Performed at [facility], [address] [place], [place] [address]    Imaging ECHOCARDIOGRAM COMPLETE BUBBLE STUDY  Result Date: 04/01/2022    ECHOCARDIOGRAM REPORT   Patient Name:   Shannon [name] Date of Exam: 04/01/2022 Medical Rec #:  [id number]      Height:       63.0 in Accession #:    [id number]     Weight:       157.4 lb Date of Birth:  22-Feb-1941      BSA:          1.747 m Patient Age:    80 years       BP:           165/82 mmHg Patient Gender: F              HR:           57 bpm. Exam Location:  [facility] Procedure: 2D Echo, Color Doppler, Cardiac Doppler and Saline Contrast Bubble  Study Indications:     I63.9 Stroke  History:         Patient has no prior history of Echocardiogram examinations.                  Risk Factors:Hypertension, Dyslipidemia and Sleep Apnea.  Sonographer:     Humphrey Rolls Referring Phys:  [id number] JAN A MANSY Diagnosing Phys: Julien Nordmann MD  Sonographer Comments: Suboptimal subcostal window. IMPRESSIONS  1. Left ventricular ejection  fraction, by estimation, is 55 to 60%. The left ventricle has normal function. The left ventricle has no regional wall motion abnormalities. Left ventricular diastolic parameters are consistent with Grade I diastolic dysfunction (impaired relaxation).  2. Right ventricular systolic function is normal. The right ventricular size is normal.  3. The mitral valve is normal in structure. Moderate mitral valve regurgitation. No evidence of mitral stenosis.  4. Tricuspid valve regurgitation is moderate.  5. The aortic valve is normal in structure. Aortic valve regurgitation is not visualized. No aortic stenosis is present.  6. The inferior vena cava is normal in size with greater than 50% respiratory variability, suggesting right atrial pressure of 3 mmHg.  7. Agitated saline contrast bubble study was negative, with no evidence of any interatrial shunt. FINDINGS  Left Ventricle: Left ventricular ejection fraction, by estimation, is 55 to 60%. The left ventricle has normal function. The left ventricle has no regional wall motion abnormalities. The left ventricular internal cavity size was normal in size. There is  no left ventricular hypertrophy. Left ventricular diastolic parameters are consistent with Grade I diastolic dysfunction (impaired relaxation). Right Ventricle: The right ventricular size is normal. No increase in right ventricular wall thickness. Right ventricular systolic function is normal. Left Atrium: Left atrial size was normal in size. Right Atrium: Right atrial size was normal in size. Pericardium: There is no evidence of pericardial effusion. Mitral Valve: The mitral valve is normal in structure. Moderate mitral valve regurgitation. No evidence of mitral valve stenosis. Tricuspid Valve: The tricuspid valve is normal in structure. Tricuspid valve regurgitation is moderate . No evidence of tricuspid stenosis. Aortic Valve: The aortic valve is normal in structure. Aortic valve regurgitation is not  visualized. No aortic stenosis is present. Aortic valve mean gradient measures 2.0 mmHg. Aortic valve peak gradient measures 4.5 mmHg. Aortic valve area, by VTI measures 1.78 cm. Pulmonic Valve: The pulmonic valve was normal in structure. Pulmonic valve regurgitation is not visualized. No evidence of pulmonic stenosis. Aorta: The aortic root is normal in size and structure. Venous: The inferior vena cava is normal in size with greater than 50% respiratory variability, suggesting right atrial pressure of 3 mmHg. IAS/Shunts: No atrial level shunt detected by color flow Doppler. Agitated saline contrast was given intravenously to evaluate for intracardiac shunting. Agitated saline contrast bubble study was negative, with no evidence of any interatrial shunt.  LEFT VENTRICLE PLAX 2D LVIDd:         3.76 cm   Diastology LVIDs:         2.57 cm   LV e' medial:    5.87 cm/s LV PW:         1.28 cm   LV E/e' medial:  13.1 LV IVS:        0.95 cm   LV e' lateral:   9.36 cm/s LVOT diam:     1.90 cm   LV E/e' lateral: 8.2 LV SV:         42 LV SV Index:   24  LVOT Area:     2.84 cm  RIGHT VENTRICLE RV Basal diam:  2.66 cm RV S prime:     15.80 cm/s LEFT ATRIUM             Index        RIGHT ATRIUM          Index LA diam:        2.70 cm 1.55 cm/m   RA Area:     9.60 cm LA Vol (A2C):   25.5 ml 14.60 ml/m  RA Volume:   18.80 ml 10.76 ml/m LA Vol (A4C):   40.8 ml 23.36 ml/m LA Biplane Vol: 35.2 ml 20.15 ml/m  AORTIC VALVE                    PULMONIC VALVE AV Area (Vmax):    2.18 cm     PV Vmax:       0.87 m/s AV Area (Vmean):   2.06 cm     PV Peak grad:  3.0 mmHg AV Area (VTI):     1.78 cm AV Vmax:           106.00 cm/s AV Vmean:          70.200 cm/s AV VTI:            0.234 m AV Peak Grad:      4.5 mmHg AV Mean Grad:      2.0 mmHg LVOT Vmax:         81.60 cm/s LVOT Vmean:        51.100 cm/s LVOT VTI:          0.147 m LVOT/AV VTI ratio: 0.63  AORTA Ao Root diam: 3.30 cm MITRAL VALVE               TRICUSPID VALVE MV Area  (PHT): 2.84 cm    TR Peak grad:   18.7 mmHg MV Decel Time: 267 msec    TR Vmax:        216.00 cm/s MV E velocity: 76.70 cm/s MV A velocity: 91.70 cm/s  SHUNTS MV E/A ratio:  0.84        Systemic VTI:  0.15 m                            Systemic Diam: 1.90 cm Julien Nordmann MD Electronically signed by Julien Nordmann MD Signature Date/Time: 04/01/2022/3:51:06 PM    Final    MR ANGIO HEAD WO CONTRAST  Result Date: 04/01/2022 CLINICAL DATA:  Stroke follow-up EXAM: MRA HEAD WITHOUT CONTRAST TECHNIQUE: Angiographic images of the Circle of Willis were acquired using MRA technique without intravenous contrast. COMPARISON:  Brain MRI from earlier today FINDINGS: Anterior circulation: Carotid arteries are widely patent where covered. No major branch occlusion, beading, or aneurysm. Proximal left M2 branches appear diminutive but these are all seen at the same level just below a probable slab interface and are favored artifactual. No ischemia in this area on prior brain MRI. Posterior circulation: Vertebral and basilar arteries are smoothly contoured and widely patent. Dominant right PICA. IMPRESSION: 1. No vascular abnormality underlying the left thalamic infarct. 2. Narrow area of left MCA branch narrowings which are favored artifactual. No ischemia in this distribution by MRI. Electronically Signed   By: Tiburcio Pea M.D.   On: 04/01/2022 12:38   US Carotid Bilateral (at [facility] and [facility] only)  Result Date: 04/01/2022 CLINICAL DATA:  Small  acute left thalamic infarct. Check for carotid embolic source. EXAM: BILATERAL CAROTID DUPLEX ULTRASOUND TECHNIQUE: Wallace Cullens scale imaging, color Doppler and duplex ultrasound were performed of bilateral carotid and vertebral arteries in the neck. COMPARISON:  None Available. FINDINGS: Criteria: Quantification of carotid stenosis is based on velocity parameters that correlate the residual internal carotid diameter with NASCET-based stenosis levels, using the diameter of the distal internal  carotid lumen as the denominator for stenosis measurement. The following velocity measurements were obtained: RIGHT ICA: 132 cm/sec CCA: 66 cm/sec SYSTOLIC ICA/CCA RATIO:  2.2 ECA: 76 cm/sec LEFT ICA: 78 cm/sec CCA: 52 cm/sec SYSTOLIC ICA/CCA RATIO:  1.5 ECA: 69 cm/sec RIGHT CAROTID ARTERY: There is a focal heterogeneous mixed plaque deposit of the posterior wall of the cervical ICA origin, visually causing approximately 50% luminal stenosis. No other focal cervical ICA stenosis is seen. There are trace plaques at the ECA origin. The common carotid artery is widely patent. RIGHT VERTEBRAL ARTERY: There is antegrade flow with PSV 56.1 centimeter/second. LEFT CAROTID ARTERY: There are small scattered nonstenosing echogenic calcific plaques along the wall of the proximal cervical ICA. The more distal artery is clear. The common carotid artery is widely patent. LEFT VERTEBRAL ARTERY: There is antegrade flow with PSV 46.9 centimeter/second. Upper extremity blood pressures: Not obtained. IMPRESSION: 1. There is focal heterogeneous plaque at the right cervical ICA origin, visually causing approximately 50% origin stenosis. The above flow velocities and ICA/CCA ratio of 2.2 are both consistent with a degree of stenosis of 50-69%. 2. Scattered calcific plaques in the proximal cervical left ICA, but visually are nonstenosing with flow velocities and ICA/CCA ratio compatible with no hemodynamically significant (50% or less) stenosis. 3. Bilateral antegrade vertebral artery flow. Electronically Signed   By: Almira Bar M.D.   On: 04/01/2022 03:39   MR BRAIN WO CONTRAST  Result Date: 04/01/2022 CLINICAL DATA:  Sudden onset numbness to face and entire right side of body; mild left facial droop EXAM: MRI HEAD WITHOUT CONTRAST TECHNIQUE: Multiplanar, multiecho pulse sequences of the brain and surrounding structures were obtained without intravenous contrast. COMPARISON:  No prior MRI, correlation is made with CT head  03/31/2022 FINDINGS: Brain: 6 mm focus of restricted diffusion in the left thalamus (series 5, image 22), with ADC correlate (series 6, image 22). No acute hemorrhage, mass, mass effect, or midline shift. No hydrocephalus or extra-axial collection. No hemosiderin deposition to suggest remote hemorrhage. Scattered T2 hyperintense signal in the periventricular white matter, likely the sequela of mild chronic small vessel ischemic disease. Vascular: Normal arterial flow voids. Skull and upper cervical spine: Normal marrow signal. Sinuses/Orbits: No acute finding. Status post bilateral lens replacements. Other: The mastoids are well aerated. 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 No additional acute intracranial process. These results were called by telephone at the time of interpretation on 04/01/2022 at 2:01 am to provider [facility] , who verbally acknowledged these results. Electronically Signed   By: Wiliam Ke M.D.   On: 04/01/2022 02:01      Time coordinating discharge: over 30 minutes  SIGNED:  Sunnie Nielsen DO [facility]

## 2022-04-02 NOTE — Plan of Care (Signed)
  Problem: Education: Goal: Knowledge of General Education information will improve Description: Including pain rating scale, medication(s)/side effects and non-pharmacologic comfort measures 04/02/2022 1101 by Morene Rankins, RN Outcome: Progressing 04/02/2022 0937 by Morene Rankins, RN Outcome: Progressing   Problem: Health Behavior/Discharge Planning: Goal: Ability to manage health-related needs will improve 04/02/2022 1101 by Morene Rankins, RN Outcome: Progressing 04/02/2022 0937 by Morene Rankins, RN Outcome: Progressing   Problem: Clinical Measurements: Goal: Ability to maintain clinical measurements within normal limits will improve 04/02/2022 1101 by Morene Rankins, RN Outcome: Progressing 04/02/2022 0937 by Morene Rankins, RN Outcome: Progressing Goal: Will remain free from infection 04/02/2022 1101 by Morene Rankins, RN Outcome: Progressing 04/02/2022 0937 by Morene Rankins, RN Outcome: Progressing Goal: Diagnostic test results will improve 04/02/2022 1101 by Morene Rankins, RN Outcome: Progressing 04/02/2022 0937 by Morene Rankins, RN Outcome: Progressing Goal: Respiratory complications will improve 04/02/2022 1101 by Morene Rankins, RN Outcome: Progressing 04/02/2022 0937 by Morene Rankins, RN Outcome: Progressing Goal: Cardiovascular complication will be avoided 04/02/2022 1101 by Morene Rankins, RN Outcome: Progressing 04/02/2022 0937 by Morene Rankins, RN Outcome: Progressing   Problem: Activity: Goal: Risk for activity intolerance will decrease 04/02/2022 1101 by Morene Rankins, RN Outcome: Progressing 04/02/2022 0937 by Morene Rankins, RN Outcome: Progressing   Problem: Nutrition: Goal: Adequate nutrition will be maintained 04/02/2022 1101 by Morene Rankins, RN Outcome: Progressing 04/02/2022 0937 by Morene Rankins, RN Outcome: Progressing   Problem: Coping: Goal: Level of anxiety will decrease 04/02/2022 1101 by Morene Rankins, RN Outcome:  Progressing 04/02/2022 0937 by Morene Rankins, RN Outcome: Progressing   Problem: Elimination: Goal: Will not experience complications related to bowel motility 04/02/2022 1101 by Morene Rankins, RN Outcome: Progressing 04/02/2022 0937 by Morene Rankins, RN Outcome: Progressing Goal: Will not experience complications related to urinary retention 04/02/2022 1101 by Morene Rankins, RN Outcome: Progressing 04/02/2022 0937 by Morene Rankins, RN Outcome: Progressing   Problem: Pain Managment: Goal: General experience of comfort will improve 04/02/2022 1101 by Morene Rankins, RN Outcome: Progressing 04/02/2022 0937 by Morene Rankins, RN Outcome: Progressing   Problem: Safety: Goal: Ability to remain free from injury will improve 04/02/2022 1101 by Morene Rankins, RN Outcome: Progressing 04/02/2022 0937 by Morene Rankins, RN Outcome: Progressing   Problem: Skin Integrity: Goal: Risk for impaired skin integrity will decrease 04/02/2022 1101 by Morene Rankins, RN Outcome: Progressing 04/02/2022 0937 by Morene Rankins, RN Outcome: Progressing   Problem: Education: Goal: Knowledge of disease or condition will improve Outcome: Progressing Goal: Knowledge of secondary prevention will improve (SELECT ALL) Outcome: Progressing Goal: Knowledge of patient specific risk factors will improve (INDIVIDUALIZE FOR PATIENT) Outcome: Progressing Goal: Individualized Educational Video(s) Outcome: Progressing   Problem: Coping: Goal: Will verbalize positive feelings about self Outcome: Progressing Goal: Will identify appropriate support needs Outcome: Progressing   Problem: Health Behavior/Discharge Planning: Goal: Ability to manage health-related needs will improve Outcome: Progressing   Problem: Self-Care: Goal: Ability to participate in self-care as condition permits will improve Outcome: Progressing Goal: Verbalization of feelings and concerns over difficulty with self-care will  improve Outcome: Progressing Goal: Ability to communicate needs accurately will improve Outcome: Progressing   Problem: Nutrition: Goal: Risk of aspiration will decrease Outcome: Progressing

## 2022-04-02 NOTE — TOC Transition Note (Signed)
Transition of Care [facility]) - CM/SW Discharge Note   Patient Details  Name: Shannon [name] MRN: [id number] Date of Birth: [birth year]  [facility]) CM/SW Contact:  Truddie Hidden, RN Phone Number: 04/02/2022, 11:33 AM   Clinical Narrative:    Spoke with patient at bedside regarding discharge, and outpatient therapy. Patient does not feel therapy is needed at this time. Provided with area outpatient clinics should she change her mind. Patient daughter was in the room and will transport her home.TOC signing off.          Patient Goals and CMS Choice        Discharge Placement                       Discharge Plan and Services                                     Social Determinants of Health (SDOH) Interventions     Readmission Risk Interventions     No data to display

## 2022-04-02 NOTE — Progress Notes (Signed)
Neurology Progress Note   S:// Seen and examined. Much better in terms of numbness of right hemibody   O:// Current vital signs: BP (!) 167/71 (BP Location: Right Arm)   Pulse (!) 53   Temp 97.8 F (36.6 C)   Resp 17   Ht 5\' 3"  (1.6 m)   Wt 71.4 kg   SpO2 96%   BMI 27.88 kg/m  Vital signs in last 24 hours: Temp:  [97.5 F (36.4 C)-98.6 F (37 C)] 97.8 F (36.6 C) (09/08 0725) Pulse Rate:  [53-61] 53 (09/08 0725) Resp:  [8-20] 17 (09/08 0725) BP: (115-167)/(38-88) 167/71 (09/08 0725) SpO2:  [94 %-100 %] 96 % (09/08 0725) GENERAL: Awake, alert in NAD HEENT: - Normocephalic and atraumatic, dry mm, no LN++, no Thyromegally LUNGS - Clear to auscultation bilaterally with no wheezes CV - S1S2 RRR, no m/r/g, equal pulses bilaterally. ABDOMEN - Soft, nontender, nondistended with normoactive BS Ext: warm, well perfused, intact peripheral pulses, no edema   NEURO:  Mental Status: AA&Ox3  Language: speech is nondysarthric.  Naming, repetition, fluency, and comprehension intact. Cranial Nerves: PERRL EOMI, visual fields full, no facial asymmetry, facial sensation diminished on the right, hearing intact, tongue/uvula/soft palate midline, normal sternocleidomastoid and trapezius muscle strength. No evidence of tongue atrophy or fibrillations Motor: No drift in any of the 4 extremities Tone: is normal and bulk is normal Sensation-intact on all fours with subjective right-sided mild numbness - better than yesterday Coordination: FTN intact bilaterally, no ataxia in BLE. Gait- deferred   NIHSS-1  Medications  Current Facility-Administered Medications:    acetaminophen (TYLENOL) tablet 650 mg, 650 mg, Oral, Q4H PRN **OR** acetaminophen (TYLENOL) 160 MG/5ML solution 650 mg, 650 mg, Per Tube, Q4H PRN **OR** acetaminophen (TYLENOL) suppository 650 mg, 650 mg, Rectal, Q4H PRN, Mansy, Jan A, MD   acetaminophen (TYLENOL) tablet 1,000 mg, 1,000 mg, Oral, Once, Feb, MD   aspirin EC  tablet 81 mg, 81 mg, Oral, Daily, Mansy, Jan A, MD, 81 mg at 04/02/22 06/02/22   clopidogrel (PLAVIX) tablet 75 mg, 75 mg, Oral, Daily, Mansy, Jan A, MD, 75 mg at 04/02/22 06/02/22   cyanocobalamin (VITAMIN B12) tablet 1,000 mcg, 1,000 mcg, Oral, Daily, Mansy, Jan A, MD, 1,000 mcg at 04/02/22 0825   enoxaparin (LOVENOX) injection 40 mg, 40 mg, Subcutaneous, Q24H, Belue, 06/02/22, RPH, 40 mg at 04/02/22 06/02/22   hydrochlorothiazide (HYDRODIURIL) tablet 12.5 mg, 12.5 mg, Oral, Daily, Mansy, Jan A, MD, 12.5 mg at 04/02/22 0825   HYDROcodone-acetaminophen (NORCO/VICODIN) 5-325 MG per tablet 1 tablet, 1 tablet, Oral, Q4H PRN, Mansy, Jan A, MD   losartan (COZAAR) tablet 100 mg, 100 mg, Oral, Daily, Mansy, Jan A, MD, 100 mg at 04/02/22 06/02/22   methocarbamol (ROBAXIN) tablet 750 mg, 750 mg, Oral, Q8H PRN, Mansy, Jan A, MD   montelukast (SINGULAIR) tablet 10 mg, 10 mg, Oral, QHS, Mansy, Jan A, MD, 10 mg at 04/01/22 2136   ondansetron (ZOFRAN) injection 4 mg, 4 mg, Intravenous, Q4H PRN, Mansy, Jan A, MD   pantoprazole (PROTONIX) EC tablet 40 mg, 40 mg, Oral, QODAY, Mansy, Jan A, MD, 40 mg at 04/01/22 06/01/22   senna-docusate (Senokot-S) tablet 1 tablet, 1 tablet, Oral, QHS PRN, Mansy, Jan A, MD   traZODone (DESYREL) tablet 25 mg, 25 mg, Oral, QHS PRN, Mansy, Jan A, MD  Labs CBC    Component Value Date/Time   WBC 6.5 03/31/2022 2301   RBC 3.90 03/31/2022 2301   HGB 12.5 03/31/2022 2301  HCT 37.3 03/31/2022 2301   PLT 248 03/31/2022 2301   MCV 95.6 03/31/2022 2301   MCH 32.1 03/31/2022 2301   MCHC 33.5 03/31/2022 2301   RDW 12.9 03/31/2022 2301   LYMPHSABS 2.0 03/31/2022 2301   MONOABS 0.6 03/31/2022 2301   EOSABS 0.1 03/31/2022 2301   BASOSABS 0.1 03/31/2022 2301    CMP     Component Value Date/Time   NA 141 04/02/2022 0558   K 3.7 04/02/2022 0558   CL 109 04/02/2022 0558   CO2 26 04/02/2022 0558   GLUCOSE 99 04/02/2022 0558   BUN 11 04/02/2022 0558   CREATININE 0.61 04/02/2022 0558   CALCIUM 9.3  04/02/2022 0558   PROT 6.9 03/31/2022 2301   ALBUMIN 4.0 03/31/2022 2301   AST 24 03/31/2022 2301   ALT 17 03/31/2022 2301   ALKPHOS 48 03/31/2022 2301   BILITOT 1.1 03/31/2022 2301   GFRNONAA >60 04/02/2022 0558     Lipid Panel     Component Value Date/Time   CHOL 240 (H) 04/01/2022 0535   TRIG 128 04/01/2022 0535   HDL 56 04/01/2022 0535   CHOLHDL 4.3 04/01/2022 0535   VLDL 26 04/01/2022 0535   LDLCALC 158 (H) 04/01/2022 0535   LDL 158 A1c 5.2   2D echo with LVEF 55 to 60%, grade 1 diastolic dysfunction.  Normal RV function.  Normal mitral valve with moderate mitral valve regurgitation.  Moderate tricuspid regurgitation.  Aortic valve normal.  Arctic valve regurgitation is not visualized.  Agitated saline study negative for interatrial shunt.  Left atrium size is normal.  Imaging I have reviewed images in epic and the results pertinent to this consultation are: CT-head-no acute changes   MRI examination of the brain-small left thalamic acute infarct.   MRA head:  No vascular abnormality underlying the left thalamic infarct. Narrow area of left MCA branch narrowings which are favored artifactual. No ischemia in this distribution by MRI.   CTA head not ordered because of patient's allergy of nausea vomiting and gastric upset with iodinated contrast.   Carotid Dopplers have already been done: Focal heterogeneous plaque at the right cervical ICA origin with 50% stenosis, ICA with 50 to 69% stenosis.  Scattered calcific plaques in the proximal cervical left ICA but no hemodynamically significant stenosis.  Bilateral antegrade vertebral artery flow.  Assessment:  81 year old woman with past medical history of hyperlipidemia and hypertension presenting with sudden onset of right-sided numbness, noted to have a small left thalamic infarct likely small vessel etiology. 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 Left carotid has no  hemodynamically significant stenosis Posterior circulation in the neck has anterograde flow Intracranial posterior circulation needs to be visualized-MRI has been ordered.  Allergic to CT dye hence CTA not being done.   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 Ideally should ne on Atorvastatin 80 for goal LDL less than 70.  She is at an LDL of 158. She says she has intolerance to all statins and Zetia. Will refer to outpatient cardiology/lipid clinic. BP goal - normotoension She will need outpatient neurology follow-up in 4 to 6 weeks after discharge. She is new to the area and wants care locally at [facility].  Please provide referral for Neurology - [name] at [facility] neurology Please also provide referral to Cardiology.  Discussed with  Dr Lyn Hollingshead and patient.    --  Amie Portland, MD Neurologist [facility] Pager: [phone]

## 2022-04-11 NOTE — Progress Notes (Signed)
no

## 2022-04-11 NOTE — Procedures (Signed)
error 

## 2022-05-30 NOTE — Progress Notes (Signed)
[facility] [address] [place], [place] [address]  Pulmonary Sleep Medicine   Office Visit Note  Patient Name: Shannon [name] DOB: 1941/05/08 MRN [id number]    Chief Complaint: Obstructive Sleep Apnea visit  Brief History:  Shannon [name] is seen today for an annual follow up visit on CPAP@12cmh20 .  The patient has a 10 year  history of sleep apnea. Patient is using PAP nightly.  The patient feels rested after sleeping with PAP.  The patient reports benefiting from PAP use. Reported sleepiness is improved and the Epworth Sleepiness Score is 6 out of 24. The patient does not take naps. The patient complains of the following: occasional mask leak but she has tried multiple masks.  The compliance download shows 89% compliance with an average use time of 6 hours 46 min. The AHI is 0.6  The patient does not complain of limb movements disrupting sleep.  ROS  General: (-) fever, (-) chills, (-) night sweat Nose and Sinuses: (-) nasal stuffiness or itchiness, (-) postnasal drip, (-) nosebleeds, (-) sinus trouble. Mouth and Throat: (-) sore throat, (-) hoarseness. Neck: (-) swollen glands, (-) enlarged thyroid, (-) neck pain. Respiratory: - cough, - shortness of breath, - wheezing. Neurologic: +numbness, +tingling right side due to recent stroke.  Psychiatric: - anxiety, - depression   Current Medication: Outpatient Encounter Medications as of 05/31/2022  Medication Sig   Evolocumab (REPATHA SURECLICK) 140 MG/ML SOAJ Inject into the skin.   [DISCONTINUED] montelukast (SINGULAIR) 10 MG tablet    Acetaminophen (TYLENOL ARTHRITIS PAIN PO) Take 650 tablets by mouth as needed (arthritis pain).   fluticasone (FLONASE) 50 MCG/ACT nasal spray Place into the nose.   hydrochlorothiazide (HYDRODIURIL) 12.5 MG tablet Take 12.5 mg by mouth daily.   hydrocortisone 2.5 % cream Apply 1 Application topically 2 (two) times daily as needed (prn).   ketotifen (ZADITOR) 0.035 % ophthalmic solution  Apply to eye.   losartan (COZAAR) 100 MG tablet Take 100 mg by mouth daily.   meloxicam (MOBIC) 15 MG tablet Take by mouth.   pantoprazole (PROTONIX) 40 MG tablet Take 40 mg by mouth daily.   Prenatal MV & Min w/FA-DHA (PRENATAL GUMMIES PO) Take 1 tablet by mouth daily.   [DISCONTINUED] aspirin 81 MG chewable tablet Chew 1 tablet (81 mg total) by mouth 2 (two) times daily. (Patient not taking: Reported on 04/01/2022)   [DISCONTINUED] DULoxetine (CYMBALTA) 20 MG capsule Take by mouth.   [DISCONTINUED] fluticasone (FLONASE) 50 MCG/ACT nasal spray Place 1 spray into both nostrils at bedtime as needed for allergies.   [DISCONTINUED] gabapentin (NEURONTIN) 300 MG capsule Take by mouth.   [DISCONTINUED] ketotifen (ZADITOR) 0.025 % ophthalmic solution Place 1 drop into both eyes as needed (prn).   [DISCONTINUED] montelukast (SINGULAIR) 10 MG tablet Take 10 mg by mouth at bedtime.   No facility-administered encounter medications on file as of 05/31/2022.    Surgical History: Past Surgical History:  Procedure Laterality Date   APPENDECTOMY     BLEPHAROPLASTY Bilateral 2016   CATARACT EXTRACTION Bilateral 2012   CHOLECYSTECTOMY     COLONOSCOPY     COLONOSCOPY WITH PROPOFOL N/A 09/17/2020   Procedure: COLONOSCOPY WITH PROPOFOL;  Surgeon: Toledo, Boykin Nearing, MD;  Location: [facility] ENDOSCOPY;  Service: Gastroenterology;  Laterality: N/A;   HERNIA REPAIR     HYSTERECTOMY ABDOMINAL WITH SALPINGO-OOPHORECTOMY  1987   and bladder tuck   KNEE ARTHROSCOPY     LAPAROSCOPIC COLON RESECTION  2014   TOTAL HIP ARTHROPLASTY Left 08/24/2018  TOTAL HIP ARTHROPLASTY Right 01/20/2022   Procedure: TOTAL HIP ARTHROPLASTY ANTERIOR APPROACH;  Surgeon: Lyndle Herrlich, MD;  Location: [facility] ORS;  Service: Orthopedics;  Laterality: Right;   TOTAL KNEE ARTHROPLASTY Right 04/11/2019   vein ligations      Medical History: Past Medical History:  Diagnosis Date   Allergy    Anxiety    Arthritis    osteoarthritis    Cataract    surgery both eyes 04/22/2011 implants   CVA (cerebral vascular accident) (HCC)    Depression    Diverticulitis    Essential hypertension    GERD (gastroesophageal reflux disease)    Heart murmur    History of cataract    Hyperlipidemia    Osteoporosis    Situational mixed anxiety and depressive disorder    Sleep apnea    cpap use    Family History: Non contributory to the present illness  Social History: Social History   Socioeconomic History   Marital status: Divorced    Spouse name: Not on file   Number of children: 3   Years of education: Not on file   Highest education level: Not on file  Occupational History   Not on file  Tobacco Use   Smoking status: Former    Packs/day: 0.25    Years: 15.00    Total pack years: 3.75    Types: Cigarettes    Quit date: 08/09/1968    Years since quitting: 53.8   Smokeless tobacco: Never  Vaping Use   Vaping Use: Never used  Substance and Sexual Activity   Alcohol use: Not Currently   Drug use: Never   Sexual activity: Not Currently  Other Topics Concern   Not on file  Social History Narrative   Lives alone   Social Determinants of Health   Financial Resource Strain: Not on file  Food Insecurity: No Food Insecurity (04/01/2022)   Hunger Vital Sign    Worried About Running Out of Food in the Last Year: Never true    Ran Out of Food in the Last Year: Never true  Transportation Needs: No Transportation Needs (04/01/2022)   PRAPARE - Administrator, Civil Service (Medical): No    Lack of Transportation (Non-Medical): No  Physical Activity: Not on file  Stress: Not on file  Social Connections: Not on file  Intimate Partner Violence: Not At Risk (04/01/2022)   Humiliation, Afraid, Rape, and Kick questionnaire    Fear of Current or Ex-Partner: No    Emotionally Abused: No    Physically Abused: No    Sexually Abused: No    Vital Signs: Blood pressure (!) 145/70, pulse (!) 56, resp. rate 14, height  5\' 4"  (1.626 m), weight 147 lb (66.7 kg), SpO2 95 %. Body mass index is 25.23 kg/m.    Examination: General Appearance: The patient is well-developed, well-nourished, and in no distress. Neck Circumference:  Skin: Gross inspection of skin unremarkable. Head: normocephalic, no gross deformities. Eyes: no gross deformities noted. ENT: ears appear grossly normal Neurologic: Alert and oriented. No involuntary movements.  STOP BANG RISK ASSESSMENT S (snore) Have you been told that you snore?     NO   T (tired) Are you often tired, fatigued, or sleepy during the day?   NO  O (obstruction) Do you stop breathing, choke, or gasp during sleep? NO   P (pressure) Do you have or are you being treated for high blood pressure? YES   B (BMI) Is your body  index greater than 35 kg/m? NO   A (age) Are you 25 years old or older? YES   N (neck) Do you have a neck circumference greater than 16 inches?   NO   G (gender) Are you a female? NO   TOTAL STOP/BANG "YES" ANSWERS 2       A STOP-Bang score of 2 or less is considered low risk, and a score of 5 or more is high risk for having either moderate or severe OSA. For people who score 3 or 4, doctors may need to perform further assessment to determine how likely they are to have OSA.         EPWORTH SLEEPINESS SCALE:  Scale:  (0)= no chance of dozing; (1)= slight chance of dozing; (2)= moderate chance of dozing; (3)= high chance of dozing  Chance  Situtation    Sitting and reading: 0    Watching TV: 1    Sitting Inactive in public: 0    As a passenger in car: 0      Lying down to rest: 3    Sitting and talking: 0    Sitting quielty after lunch: 2    In a car, stopped in traffic: 0   TOTAL SCORE:   6 out of 24    SLEEP STUDIES:  Split ( 05/23/2012)- AHI 14, SP02 min 81%  CPAP titration- CPAP@12    CPAP COMPLIANCE DATA:  Date Range: 05/29/21-05/28/2022  Average Daily Use: 6 hours 46 min  Median Use: 6 hrs 59  min  Compliance for > 4 Hours: 89% days  AHI 0.6 respiratory events per hour  Days Used: 339/365  Mask Leak: 24.1  95th Percentile Pressure: 12         LABS: Recent Results (from the past 2160 hour(s))  Ethanol     Status: None   Collection Time: 03/31/22 11:01 PM  Result Value Ref Range   Alcohol, Ethyl (B) <10 <10 mg/dL    Comment: (NOTE) Lowest detectable limit for serum alcohol is 10 mg/dL.  For medical purposes only. Performed at [facility], [address] [place], [place] [address]   Protime-INR     Status: None   Collection Time: 03/31/22 11:01 PM  Result Value Ref Range   Prothrombin Time 13.7 11.4 - 15.2 seconds   INR 1.1 0.8 - 1.2    Comment: (NOTE) INR goal varies based on device and disease states. Performed at [facility], [address] [place], [place] [address]   APTT     Status: None   Collection Time: 03/31/22 11:01 PM  Result Value Ref Range   aPTT 25 24 - 36 seconds    Comment: Performed at [facility], [address] [place], [place] [address]  CBC     Status: None   Collection Time: 03/31/22 11:01 PM  Result Value Ref Range   WBC 6.5 4.0 - 10.5 K/uL   RBC 3.90 3.87 - 5.11 MIL/uL   Hemoglobin 12.5 12.0 - 15.0 g/dL   HCT 13.0 86.5 - 78.4 %   MCV 95.6 80.0 - 100.0 fL   MCH 32.1 26.0 - 34.0 pg   MCHC 33.5 30.0 - 36.0 g/dL   RDW 69.6 29.5 - 28.4 %   Platelets 248 150 - 400 K/uL   nRBC 0.0 0.0 - 0.2 %    Comment: Performed at [facility], [address] [place], [place] [address]  Differential     Status: None  Collection Time: 03/31/22 11:01 PM  Result Value Ref Range   Neutrophils Relative % 58 %   Neutro Abs 3.7 1.7 - 7.7 K/uL   Lymphocytes Relative 30 %   Lymphs Abs 2.0 0.7 - 4.0 K/uL   Monocytes Relative 9 %   Monocytes Absolute 0.6 0.1 - 1.0 K/uL   Eosinophils Relative 2 %   Eosinophils Absolute 0.1 0.0 - 0.5 K/uL   Basophils Relative 1 %   Basophils Absolute 0.1 0.0 - 0.1  K/uL   Immature Granulocytes 0 %   Abs Immature Granulocytes 0.02 0.00 - 0.07 K/uL    Comment: Performed at [facility], [address] [place], [place] [address]  Comprehensive metabolic panel     Status: Abnormal   Collection Time: 03/31/22 11:01 PM  Result Value Ref Range   Sodium 141 135 - 145 mmol/L   Potassium 3.3 (L) 3.5 - 5.1 mmol/L   Chloride 106 98 - 111 mmol/L   CO2 27 22 - 32 mmol/L   Glucose, Bld 104 (H) 70 - 99 mg/dL    Comment: Glucose reference range applies only to samples taken after fasting for at least 8 hours.   BUN 11 8 - 23 mg/dL   Creatinine, Ser 6.06 0.44 - 1.00 mg/dL   Calcium 8.9 8.9 - 30.1 mg/dL   Total Protein 6.9 6.5 - 8.1 g/dL   Albumin 4.0 3.5 - 5.0 g/dL   AST 24 15 - 41 U/L   ALT 17 0 - 44 U/L   Alkaline Phosphatase 48 38 - 126 U/L   Total Bilirubin 1.1 0.3 - 1.2 mg/dL   GFR, Estimated >60 >10 mL/min    Comment: (NOTE) Calculated using the CKD-EPI Creatinine Equation (2021)    Anion gap 8 5 - 15    Comment: Performed at [facility], [address] [place], [place] [address]  Hemoglobin A1c     Status: None   Collection Time: 03/31/22 11:01 PM  Result Value Ref Range   Hgb A1c MFr Bld 5.2 4.8 - 5.6 %    Comment: (NOTE) Pre diabetes:          5.7%-6.4%  Diabetes:              >6.4%  Glycemic control for   <7.0% adults with diabetes    Mean Plasma Glucose 102.54 mg/dL    Comment: Performed at [facility], [address], [place], [place] [address]  CBG monitoring, ED     Status: None   Collection Time: 03/31/22 11:02 PM  Result Value Ref Range   Glucose-Capillary 98 70 - 99 mg/dL    Comment: Glucose reference range applies only to samples taken after fasting for at least 8 hours.  Resp Panel by RT-PCR (Flu A&B, Covid) Anterior Nasal Swab     Status: None   Collection Time: 03/31/22 11:30 PM   Specimen: Anterior Nasal Swab  Result Value Ref Range   SARS Coronavirus 2 by RT PCR NEGATIVE NEGATIVE    Comment:  (NOTE) SARS-CoV-2 target nucleic acids are NOT DETECTED.  The SARS-CoV-2 RNA is generally detectable in upper respiratory specimens during the acute phase of infection. The lowest concentration of SARS-CoV-2 viral copies this assay can detect is 138 copies/mL. A negative result does not preclude SARS-Cov-2 infection and should not be used as the sole basis for treatment or other patient management decisions. A negative result may occur with  improper specimen collection/handling, submission of specimen other than  nasopharyngeal swab, presence of viral mutation(s) within the areas targeted by this assay, and inadequate number of viral copies(<138 copies/mL). A negative result must be combined with clinical observations, patient history, and epidemiological information. The expected result is Negative.  Fact Sheet for Patients:  BloggerCourse.com  Fact Sheet for Healthcare Providers:  SeriousBroker.it  This test is no t yet approved or cleared by the [place] FDA and  has been authorized for detection and/or diagnosis of SARS-CoV-2 by FDA under an Emergency Use Authorization (EUA). This EUA will remain  in effect (meaning this test can be used) for the duration of the COVID-19 declaration under Section 564(b)(1) of the Act, 21 U.S.C.section 360bbb-3(b)(1), unless the authorization is terminated  or revoked sooner.       Influenza A by PCR NEGATIVE NEGATIVE   Influenza B by PCR NEGATIVE NEGATIVE    Comment: (NOTE) The Xpert Xpress SARS-CoV-2/FLU/RSV plus assay is intended as an aid in the diagnosis of influenza from Nasopharyngeal swab specimens and should not be used as a sole basis for treatment. Nasal washings and aspirates are unacceptable for Xpert Xpress SARS-CoV-2/FLU/RSV testing.  Fact Sheet for Patients: BloggerCourse.com  Fact Sheet for Healthcare  Providers: SeriousBroker.it  This test is not yet approved or cleared by the [place] FDA and has been authorized for detection and/or diagnosis of SARS-CoV-2 by FDA under an Emergency Use Authorization (EUA). This EUA will remain in effect (meaning this test can be used) for the duration of the COVID-19 declaration under Section 564(b)(1) of the Act, 21 U.S.C. section 360bbb-3(b)(1), unless the authorization is terminated or revoked.  Performed at [facility], [address] [place], [place] [address]   Urine Drug Screen, Qualitative     Status: None   Collection Time: 03/31/22 11:30 PM  Result Value Ref Range   Tricyclic, Ur Screen NONE DETECTED NONE DETECTED   Amphetamines, Ur Screen NONE DETECTED NONE DETECTED   MDMA (Ecstasy)Ur Screen NONE DETECTED NONE DETECTED   Cocaine Metabolite,Ur [place] NONE DETECTED NONE DETECTED   Opiate, Ur Screen NONE DETECTED NONE DETECTED   Phencyclidine (PCP) Ur S NONE DETECTED NONE DETECTED   Cannabinoid 50 Ng, Ur [place] NONE DETECTED NONE DETECTED   Barbiturates, Ur Screen NONE DETECTED NONE DETECTED   Benzodiazepine, Ur Scrn NONE DETECTED NONE DETECTED   Methadone Scn, Ur NONE DETECTED NONE DETECTED    Comment: (NOTE) Tricyclics + metabolites, urine    Cutoff 1000 ng/mL Amphetamines + metabolites, urine  Cutoff 1000 ng/mL MDMA (Ecstasy), urine              Cutoff 500 ng/mL Cocaine Metabolite, urine          Cutoff 300 ng/mL Opiate + metabolites, urine        Cutoff 300 ng/mL Phencyclidine (PCP), urine         Cutoff 25 ng/mL Cannabinoid, urine                 Cutoff 50 ng/mL Barbiturates + metabolites, urine  Cutoff 200 ng/mL Benzodiazepine, urine              Cutoff 200 ng/mL Methadone, urine                   Cutoff 300 ng/mL  The urine drug screen provides only a preliminary, unconfirmed analytical test result and should not be used for non-medical purposes. Clinical consideration and professional  judgment should be applied to any positive drug screen result due to possible interfering  substances. A more specific alternate chemical method must be used in order to obtain a confirmed analytical result. Gas chromatography / mass spectrometry (GC/MS) is the preferred confirm atory method. Performed at [facility], [address] [place], [place] [address]   Urinalysis, Routine w reflex microscopic Anterior Nasal Swab     Status: Abnormal   Collection Time: 03/31/22 11:30 PM  Result Value Ref Range   Color, Urine STRAW (A) YELLOW   APPearance CLEAR (A) CLEAR   Specific Gravity, Urine 1.005 1.005 - 1.030   pH 6.0 5.0 - 8.0   Glucose, UA NEGATIVE NEGATIVE mg/dL   Hgb urine dipstick NEGATIVE NEGATIVE   Bilirubin Urine NEGATIVE NEGATIVE   Ketones, ur NEGATIVE NEGATIVE mg/dL   Protein, ur NEGATIVE NEGATIVE mg/dL   Nitrite NEGATIVE NEGATIVE   Leukocytes,Ua NEGATIVE NEGATIVE    Comment: Performed at [facility], [address] [place], [place] [address]  Lipid panel     Status: Abnormal   Collection Time: 04/01/22  5:35 AM  Result Value Ref Range   Cholesterol 240 (H) 0 - 200 mg/dL   Triglycerides 128 <150 mg/dL   HDL 56 >40 mg/dL   Total CHOL/HDL Ratio 4.3 RATIO   VLDL 26 0 - 40 mg/dL   LDL Cholesterol 158 (H) 0 - 99 mg/dL    Comment:        Total Cholesterol/HDL:CHD Risk Coronary Heart Disease Risk Table                     Men   Women  1/2 Average Risk   3.4   3.3  Average Risk       5.0   4.4  2 X Average Risk   9.6   7.1  3 X Average Risk  23.4   11.0        Use the calculated Patient Ratio above and the CHD Risk Table to determine the patient's CHD Risk.        ATP III CLASSIFICATION (LDL):  <100     mg/dL   Optimal  100-129  mg/dL   Near or Above                    Optimal  130-159  mg/dL   Borderline  160-189  mg/dL   High  >190     mg/dL   Very High Performed at [facility], [address] [place], [place] [address]    Magnesium     Status: None   Collection Time: 04/01/22  5:35 AM  Result Value Ref Range   Magnesium 1.8 1.7 - 2.4 mg/dL    Comment: Performed at [facility], [address] [place], [place] [address]  TSH     Status: None   Collection Time: 04/01/22  5:35 AM  Result Value Ref Range   TSH 4.023 0.350 - 4.500 uIU/mL    Comment: Performed by a 3rd Generation assay with a functional sensitivity of <=0.01 uIU/mL. Performed at [facility], [address] [place], [place] [address]   ECHOCARDIOGRAM COMPLETE BUBBLE STUDY     Status: None   Collection Time: 04/01/22 11:01 AM  Result Value Ref Range   Ao pk vel 1.06 m/s   AV Area VTI 1.78 cm2   AR max vel 2.18 cm2   AV Mean grad 2.0 mmHg   AV Peak grad 4.5 mmHg   S' Lateral 2.57 cm   AV Area mean  vel 2.06 cm2   Area-P 1/2 2.84 cm2  Basic metabolic panel     Status: None   Collection Time: 04/02/22  5:58 AM  Result Value Ref Range   Sodium 141 135 - 145 mmol/L   Potassium 3.7 3.5 - 5.1 mmol/L   Chloride 109 98 - 111 mmol/L   CO2 26 22 - 32 mmol/L   Glucose, Bld 99 70 - 99 mg/dL    Comment: Glucose reference range applies only to samples taken after fasting for at least 8 hours.   BUN 11 8 - 23 mg/dL   Creatinine, Ser 1.61 0.44 - 1.00 mg/dL   Calcium 9.3 8.9 - 09.6 mg/dL   GFR, Estimated >04 >54 mL/min    Comment: (NOTE) Calculated using the CKD-EPI Creatinine Equation (2021)    Anion gap 6 5 - 15    Comment: Performed at [facility], [address] [place], [place] [address]    Radiology: ECHOCARDIOGRAM COMPLETE BUBBLE STUDY  Result Date: 04/01/2022    ECHOCARDIOGRAM REPORT   Patient Name:   ESMEE FALLAW Date of Exam: 04/01/2022 Medical Rec #:  [id number]      Height:       63.0 in Accession #:    [id number]     Weight:       157.4 lb Date of Birth:  1941/06/07      BSA:          1.747 m Patient Age:    80 years       BP:           165/82 mmHg Patient Gender: F              HR:           57 bpm.  Exam Location:  [facility] Procedure: 2D Echo, Color Doppler, Cardiac Doppler and Saline Contrast Bubble            Study Indications:     I63.9 Stroke  History:         Patient has no prior history of Echocardiogram examinations.                  Risk Factors:Hypertension, Dyslipidemia and Sleep Apnea.  Sonographer:     Humphrey Rolls Referring Phys:  [id number] JAN A MANSY Diagnosing Phys: Julien Nordmann MD  Sonographer Comments: Suboptimal subcostal window. IMPRESSIONS  1. Left ventricular ejection fraction, by estimation, is 55 to 60%. The left ventricle has normal function. The left ventricle has no regional wall motion abnormalities. Left ventricular diastolic parameters are consistent with Grade I diastolic dysfunction (impaired relaxation).  2. Right ventricular systolic function is normal. The right ventricular size is normal.  3. The mitral valve is normal in structure. Moderate mitral valve regurgitation. No evidence of mitral stenosis.  4. Tricuspid valve regurgitation is moderate.  5. The aortic valve is normal in structure. Aortic valve regurgitation is not visualized. No aortic stenosis is present.  6. The inferior vena cava is normal in size with greater than 50% respiratory variability, suggesting right atrial pressure of 3 mmHg.  7. Agitated saline contrast bubble study was negative, with no evidence of any interatrial shunt. FINDINGS  Left Ventricle: Left ventricular ejection fraction, by estimation, is 55 to 60%. The left ventricle has normal function. The left ventricle has no regional wall motion abnormalities. The left ventricular internal cavity size was normal in size. There is  no left ventricular hypertrophy. Left ventricular diastolic parameters are consistent with  Grade I diastolic dysfunction (impaired relaxation). Right Ventricle: The right ventricular size is normal. No increase in right ventricular wall thickness. Right ventricular systolic function is normal. Left Atrium: Left atrial size was  normal in size. Right Atrium: Right atrial size was normal in size. Pericardium: There is no evidence of pericardial effusion. Mitral Valve: The mitral valve is normal in structure. Moderate mitral valve regurgitation. No evidence of mitral valve stenosis. Tricuspid Valve: The tricuspid valve is normal in structure. Tricuspid valve regurgitation is moderate . No evidence of tricuspid stenosis. Aortic Valve: The aortic valve is normal in structure. Aortic valve regurgitation is not visualized. No aortic stenosis is present. Aortic valve mean gradient measures 2.0 mmHg. Aortic valve peak gradient measures 4.5 mmHg. Aortic valve area, by VTI measures 1.78 cm. Pulmonic Valve: The pulmonic valve was normal in structure. Pulmonic valve regurgitation is not visualized. No evidence of pulmonic stenosis. Aorta: The aortic root is normal in size and structure. Venous: The inferior vena cava is normal in size with greater than 50% respiratory variability, suggesting right atrial pressure of 3 mmHg. IAS/Shunts: No atrial level shunt detected by color flow Doppler. Agitated saline contrast was given intravenously to evaluate for intracardiac shunting. Agitated saline contrast bubble study was negative, with no evidence of any interatrial shunt.  LEFT VENTRICLE PLAX 2D LVIDd:         3.76 cm   Diastology LVIDs:         2.57 cm   LV e' medial:    5.87 cm/s LV PW:         1.28 cm   LV E/e' medial:  13.1 LV IVS:        0.95 cm   LV e' lateral:   9.36 cm/s LVOT diam:     1.90 cm   LV E/e' lateral: 8.2 LV SV:         42 LV SV Index:   24 LVOT Area:     2.84 cm  RIGHT VENTRICLE RV Basal diam:  2.66 cm RV S prime:     15.80 cm/s LEFT ATRIUM             Index        RIGHT ATRIUM          Index LA diam:        2.70 cm 1.55 cm/m   RA Area:     9.60 cm LA Vol (A2C):   25.5 ml 14.60 ml/m  RA Volume:   18.80 ml 10.76 ml/m LA Vol (A4C):   40.8 ml 23.36 ml/m LA Biplane Vol: 35.2 ml 20.15 ml/m  AORTIC VALVE                    PULMONIC  VALVE AV Area (Vmax):    2.18 cm     PV Vmax:       0.87 m/s AV Area (Vmean):   2.06 cm     PV Peak grad:  3.0 mmHg AV Area (VTI):     1.78 cm AV Vmax:           106.00 cm/s AV Vmean:          70.200 cm/s AV VTI:            0.234 m AV Peak Grad:      4.5 mmHg AV Mean Grad:      2.0 mmHg LVOT Vmax:         81.60 cm/s LVOT Vmean:  51.100 cm/s LVOT VTI:          0.147 m LVOT/AV VTI ratio: 0.63  AORTA Ao Root diam: 3.30 cm MITRAL VALVE               TRICUSPID VALVE MV Area (PHT): 2.84 cm    TR Peak grad:   18.7 mmHg MV Decel Time: 267 msec    TR Vmax:        216.00 cm/s MV E velocity: 76.70 cm/s MV A velocity: 91.70 cm/s  SHUNTS MV E/A ratio:  0.84        Systemic VTI:  0.15 m                            Systemic Diam: 1.90 cm Julien Nordmann MD Electronically signed by Julien Nordmann MD Signature Date/Time: 04/01/2022/3:51:06 PM    Final    MR ANGIO HEAD WO CONTRAST  Result Date: 04/01/2022 CLINICAL DATA:  Stroke follow-up EXAM: MRA HEAD WITHOUT CONTRAST TECHNIQUE: Angiographic images of the Circle of [place] were acquired using MRA technique without intravenous contrast. COMPARISON:  Brain MRI from earlier today FINDINGS: Anterior circulation: Carotid arteries are widely patent where covered. No major branch occlusion, beading, or aneurysm. Proximal left M2 branches appear diminutive but these are all seen at the same level just below a probable slab interface and are favored artifactual. No ischemia in this area on prior brain MRI. Posterior circulation: Vertebral and basilar arteries are smoothly contoured and widely patent. Dominant right PICA. IMPRESSION: 1. No vascular abnormality underlying the left thalamic infarct. 2. Narrow area of left MCA branch narrowings which are favored artifactual. No ischemia in this distribution by MRI. Electronically Signed   By: Tiburcio Pea M.D.   On: 04/01/2022 12:38   US Carotid Bilateral (at [facility] and [facility] only)  Result Date: 04/01/2022 CLINICAL DATA:  Small acute  left thalamic infarct. Check for carotid embolic source. EXAM: BILATERAL CAROTID DUPLEX ULTRASOUND TECHNIQUE: Wallace Cullens scale imaging, color Doppler and duplex ultrasound were performed of bilateral carotid and vertebral arteries in the neck. COMPARISON:  None Available. FINDINGS: Criteria: Quantification of carotid stenosis is based on velocity parameters that correlate the residual internal carotid diameter with NASCET-based stenosis levels, using the diameter of the distal internal carotid lumen as the denominator for stenosis measurement. The following velocity measurements were obtained: RIGHT ICA: 132 cm/sec CCA: 66 cm/sec SYSTOLIC ICA/CCA RATIO:  2.2 ECA: 76 cm/sec LEFT ICA: 78 cm/sec CCA: 52 cm/sec SYSTOLIC ICA/CCA RATIO:  1.5 ECA: 69 cm/sec RIGHT CAROTID ARTERY: There is a focal heterogeneous mixed plaque deposit of the posterior wall of the cervical ICA origin, visually causing approximately 50% luminal stenosis. No other focal cervical ICA stenosis is seen. There are trace plaques at the ECA origin. The common carotid artery is widely patent. RIGHT VERTEBRAL ARTERY: There is antegrade flow with PSV 56.1 centimeter/second. LEFT CAROTID ARTERY: There are small scattered nonstenosing echogenic calcific plaques along the wall of the proximal cervical ICA. The more distal artery is clear. The common carotid artery is widely patent. LEFT VERTEBRAL ARTERY: There is antegrade flow with PSV 46.9 centimeter/second. Upper extremity blood pressures: Not obtained. IMPRESSION: 1. There is focal heterogeneous plaque at the right cervical ICA origin, visually causing approximately 50% origin stenosis. The above flow velocities and ICA/CCA ratio of 2.2 are both consistent with a degree of stenosis of 50-69%. 2. Scattered calcific plaques in the proximal cervical left ICA, but visually are nonstenosing  with flow velocities and ICA/CCA ratio compatible with no hemodynamically significant (50% or less) stenosis. 3. Bilateral  antegrade vertebral artery flow. Electronically Signed   By: Almira Bar M.D.   On: 04/01/2022 03:39   MR BRAIN WO CONTRAST  Result Date: 04/01/2022 CLINICAL DATA:  Sudden onset numbness to face and entire right side of body; mild left facial droop EXAM: MRI HEAD WITHOUT CONTRAST TECHNIQUE: Multiplanar, multiecho pulse sequences of the brain and surrounding structures were obtained without intravenous contrast. COMPARISON:  No prior MRI, correlation is made with CT head 03/31/2022 FINDINGS: Brain: 6 mm focus of restricted diffusion in the left thalamus (series 5, image 22), with ADC correlate (series 6, image 22). No acute hemorrhage, mass, mass effect, or midline shift. No hydrocephalus or extra-axial collection. No hemosiderin deposition to suggest remote hemorrhage. Scattered T2 hyperintense signal in the periventricular white matter, likely the sequela of mild chronic small vessel ischemic disease. Vascular: Normal arterial flow voids. Skull and upper cervical spine: Normal marrow signal. Sinuses/Orbits: No acute finding. Status post bilateral lens replacements. Other: The mastoids are well aerated. 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 No additional acute intracranial process. These results were called by telephone at the time of interpretation on 04/01/2022 at 2:01 am to provider [facility] , who verbally acknowledged these results. Electronically Signed   By: Wiliam Ke M.D.   On: 04/01/2022 02:01    No results found.  No results found.    Assessment and Plan: Patient Active Problem List   Diagnosis Date Noted   CVA (cerebral vascular accident) (HCC) 05/31/2022   Vitamin B12 deficiency 04/01/2022   Right thalamic infarction (HCC) 04/01/2022   Carotid atherosclerosis, bilateral 04/01/2022   Internal carotid artery stenosis, right 04/01/2022   Right sided numbness    History of total hip replacement, right 01/20/2022   OSA on CPAP 06/02/2020   CPAP use counseling  06/02/2020   Seasonal allergies 06/02/2020   Essential hypertension 12/25/2015    1. OSA on CPAP The patient does tolerate PAP and reports  benefit from PAP use. The patient was reminded how to clean equipment and advised to replace supplies routinely. The patient was also counselled on weight loss. The compliance is good, there was a month where she was unable to use cpap due to surgery. . The AHI is 0.6.   OSA on cpap- CPAP continues to be medically necessary to treat this patient's OSA.  Continue with good compliance- f/u one year.   2. CPAP use counseling CPAP Counseling: had a lengthy discussion with the patient regarding the importance of PAP therapy in management of the sleep apnea. Patient appears to understand the risk factor reduction and also understands the risks associated with untreated sleep apnea. Patient will try to make a good faith effort to remain compliant with therapy. Also instructed the patient on proper cleaning of the device including the water must be changed daily if possible and use of distilled water is preferred. Patient understands that the machine should be regularly cleaned with appropriate recommended cleaning solutions that do not damage the PAP machine for example given white vinegar and water rinses. Other methods such as ozone treatment may not be as good as these simple methods to achieve cleaning.   3. Essential hypertension Hypertension Counseling:   The following hypertensive lifestyle modification were recommended and discussed:  1. Limiting alcohol intake to less than 1 oz/day of ethanol:(24 oz of beer or 8 oz of wine or 2 oz  of 100-proof whiskey). 2. Take baby ASA 81 mg daily. 3. Importance of regular aerobic exercise and losing weight. 4. Reduce dietary saturated fat and cholesterol intake for overall cardiovascular health. 5. Maintaining adequate dietary potassium, calcium, and magnesium intake. 6. Regular monitoring of the blood pressure. 7.  Reduce sodium intake to less than 100 mmol/day (less than 2.3 gm of sodium or less than 6 gm of sodium choride)    4. Cerebrovascular accident (CVA), unspecified mechanism (HCC) She is recovering well. Emphasized importance of treating OSA.    General Counseling: I have discussed the findings of the evaluation and examination with Shannon [name].  I have also discussed any further diagnostic evaluation thatmay be needed or ordered today. Jannae verbalizes understanding of the findings of todays visit. We also reviewed her medications today and discussed drug interactions and side effects including but not limited excessive drowsiness and altered mental states. We also discussed that there is always a risk not just to her but also people around her. she has been encouraged to call the office with any questions or concerns that should arise related to todays visit.  No orders of the defined types were placed in this encounter.       I have personally obtained a history, examined the patient, evaluated laboratory and imaging results, formulated the assessment and plan and placed orders. This patient was seen today by Emmaline Kluver, PA-C in collaboration with Dr. Freda Munro.   Yevonne Pax, MD [facility] Diplomate ABMS Pulmonary Critical Care Medicine and Sleep Medicine

## 2022-05-31 ENCOUNTER — Ambulatory Visit (INDEPENDENT_AMBULATORY_CARE_PROVIDER_SITE_OTHER): Payer: Medicare Other | Admitting: Internal Medicine

## 2022-05-31 ENCOUNTER — Other Ambulatory Visit: Payer: Self-pay | Admitting: Family Medicine

## 2022-05-31 VITALS — BP 145/70 | HR 56 | Resp 14 | Ht 64.0 in | Wt 147.0 lb

## 2022-05-31 DIAGNOSIS — I639 Cerebral infarction, unspecified: Secondary | ICD-10-CM | POA: Diagnosis not present

## 2022-05-31 DIAGNOSIS — Z7189 Other specified counseling: Secondary | ICD-10-CM | POA: Diagnosis not present

## 2022-05-31 DIAGNOSIS — I6521 Occlusion and stenosis of right carotid artery: Secondary | ICD-10-CM

## 2022-05-31 DIAGNOSIS — I10 Essential (primary) hypertension: Secondary | ICD-10-CM | POA: Diagnosis not present

## 2022-05-31 DIAGNOSIS — Z1231 Encounter for screening mammogram for malignant neoplasm of breast: Secondary | ICD-10-CM

## 2022-05-31 DIAGNOSIS — G4733 Obstructive sleep apnea (adult) (pediatric): Secondary | ICD-10-CM

## 2022-05-31 NOTE — Patient Instructions (Signed)

## 2022-06-24 HISTORY — PX: OTHER SURGICAL HISTORY: SHX169

## 2022-07-16 DIAGNOSIS — Z8673 Personal history of transient ischemic attack (TIA), and cerebral infarction without residual deficits: Secondary | ICD-10-CM | POA: Insufficient documentation

## 2022-11-29 ENCOUNTER — Ambulatory Visit
Admission: RE | Admit: 2022-11-29 | Discharge: 2022-11-29 | Disposition: A | Discharge status: Home / Self Care | Payer: Medicare Other | Source: Ambulatory Visit | Attending: Family Medicine | Admitting: Family Medicine

## 2022-11-29 DIAGNOSIS — Z1231 Encounter for screening mammogram for malignant neoplasm of breast: Secondary | ICD-10-CM | POA: Insufficient documentation

## 2023-04-11 ENCOUNTER — Encounter: Payer: Self-pay | Admitting: Internal Medicine

## 2023-04-11 ENCOUNTER — Inpatient Hospital Stay: Payer: Medicare Other | Attending: Internal Medicine | Admitting: Internal Medicine

## 2023-04-11 ENCOUNTER — Inpatient Hospital Stay: Payer: Medicare Other

## 2023-04-11 VITALS — BP 120/55 | HR 72 | Temp 98.5°F | Wt 146.8 lb

## 2023-04-11 DIAGNOSIS — I10 Essential (primary) hypertension: Secondary | ICD-10-CM | POA: Diagnosis not present

## 2023-04-11 DIAGNOSIS — Z87891 Personal history of nicotine dependence: Secondary | ICD-10-CM | POA: Insufficient documentation

## 2023-04-11 DIAGNOSIS — D472 Monoclonal gammopathy: Secondary | ICD-10-CM

## 2023-04-11 DIAGNOSIS — E785 Hyperlipidemia, unspecified: Secondary | ICD-10-CM | POA: Diagnosis not present

## 2023-04-11 DIAGNOSIS — Z79899 Other long term (current) drug therapy: Secondary | ICD-10-CM | POA: Diagnosis not present

## 2023-04-11 LAB — CBC WITH DIFFERENTIAL/PLATELET
Abs Immature Granulocytes: 0.04 10*3/uL (ref 0.00–0.07)
Basophils Absolute: 0.1 10*3/uL (ref 0.0–0.1)
Basophils Relative: 1 %
Eosinophils Absolute: 0.1 10*3/uL (ref 0.0–0.5)
Eosinophils Relative: 1 %
HCT: 43.1 % (ref 36.0–46.0)
Hemoglobin: 14.7 g/dL (ref 12.0–15.0)
Immature Granulocytes: 1 %
Lymphocytes Relative: 21 %
Lymphs Abs: 1.5 10*3/uL (ref 0.7–4.0)
MCH: 33.2 pg (ref 26.0–34.0)
MCHC: 34.1 g/dL (ref 30.0–36.0)
MCV: 97.3 fL (ref 80.0–100.0)
Monocytes Absolute: 0.5 10*3/uL (ref 0.1–1.0)
Monocytes Relative: 7 %
Neutro Abs: 5 10*3/uL (ref 1.7–7.7)
Neutrophils Relative %: 69 %
Platelets: 279 10*3/uL (ref 150–400)
RBC: 4.43 MIL/uL (ref 3.87–5.11)
RDW: 12.6 % (ref 11.5–15.5)
WBC: 7.2 10*3/uL (ref 4.0–10.5)
nRBC: 0 % (ref 0.0–0.2)

## 2023-04-11 LAB — COMPREHENSIVE METABOLIC PANEL
ALT: 18 U/L (ref 0–44)
AST: 19 U/L (ref 15–41)
Albumin: 4.5 g/dL (ref 3.5–5.0)
Alkaline Phosphatase: 47 U/L (ref 38–126)
Anion gap: 10 (ref 5–15)
BUN: 18 mg/dL (ref 8–23)
CO2: 26 mmol/L (ref 22–32)
Calcium: 9.5 mg/dL (ref 8.9–10.3)
Chloride: 99 mmol/L (ref 98–111)
Creatinine, Ser: 0.75 mg/dL (ref 0.44–1.00)
GFR, Estimated: >60 mL/min (ref >60)
Glucose, Bld: 104 mg/dL — ABNORMAL HIGH (ref 70–99)
Potassium: 3.9 mmol/L (ref 3.5–5.1)
Sodium: 135 mmol/L (ref 135–145)
Total Bilirubin: 1.2 mg/dL (ref 0.3–1.2)
Total Protein: 7.4 g/dL (ref 6.5–8.1)

## 2023-04-11 NOTE — Progress Notes (Signed)
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  Patient follows with Dr. Sherryll Burger of neurology with her history of small left thalamus infarct presented as right-sided numbness on 03/31/2022.  She is on 81 mg aspirin.  She has residual right facial and right hand numbness.  As a part of neuropathy workup, SPEP/IFE done in April 2024 showed faint IgM monoclonal protein with kappa light chain specificity.  Repeat performed on 04/01/2023 showing persistent faint band.  No evidence of anemia, renal dysfunction or hypercalcemia.  She is on Repatha for hyperlipidemia.  Intolerant to statin.  REVIEW OF SYSTEMS:   ROS  As per HPI. Otherwise, a complete review of systems is negative.  PAST MEDICAL HISTORY: Past Medical History:  Diagnosis Date   Allergy    Anxiety    Arthritis    osteoarthritis   Cataract    surgery both eyes 04/22/2011 implants   CVA (cerebral vascular accident) (HCC)    Depression    Diverticulitis    Essential hypertension    GERD (gastroesophageal reflux disease)    Heart murmur    History of cataract    Hyperlipidemia    Osteoporosis    Situational mixed anxiety and depressive disorder    Sleep apnea    cpap use    PAST SURGICAL HISTORY: Past Surgical History:  Procedure Laterality Date   APPENDECTOMY     BLEPHAROPLASTY Bilateral 2016   CATARACT EXTRACTION Bilateral 2012   CHOLECYSTECTOMY     COLONOSCOPY     COLONOSCOPY WITH PROPOFOL N/A 09/17/2020   Procedure:  COLONOSCOPY WITH PROPOFOL;  Surgeon: Toledo, Boykin Nearing, MD;  Location: [facility] ENDOSCOPY;  Service: Gastroenterology;  Laterality: N/A;   HERNIA REPAIR     HYSTERECTOMY ABDOMINAL WITH SALPINGO-OOPHORECTOMY  1987   and bladder tuck   KNEE ARTHROSCOPY     LAPAROSCOPIC COLON RESECTION  2014   TOTAL HIP ARTHROPLASTY Left 08/24/2018   TOTAL HIP ARTHROPLASTY Right 01/20/2022   Procedure: TOTAL HIP ARTHROPLASTY ANTERIOR APPROACH;  Surgeon: Lyndle Herrlich, MD;  Location: [facility] ORS;  Service: Orthopedics;  Laterality: Right;   TOTAL KNEE ARTHROPLASTY Right 04/11/2019   vein ligations      FAMILY HISTORY: Family History  Problem Relation Age of Onset   Cancer Father    Osteoarthritis Father    Ulcers Father    Cancer Sister        liver, lungs   Arthritis Sister        rheumatoid   Hypertension Sister    Hyperlipidemia Sister    Diabetes Sister    GI Disease Sister    Stroke Sister    Liver disease Sister    Arthritis Sister    Diabetes Brother    Osteoarthritis Brother    Skin cancer Brother    Cancer Brother        pancreatic, kidney   Prostate cancer Brother    Breast cancer Niece        late 25's   Breast cancer Niece     HEALTH MAINTENANCE: Social History  Tobacco Use   Smoking status: Former    Current packs/day: 0.00    Average packs/day: 0.3 packs/day for 15.0 years (3.8 ttl pk-yrs)    Types: Cigarettes    Start date: 08/09/1953    Quit date: 08/09/1968    Years since quitting: 54.7   Smokeless tobacco: Never  Vaping Use   Vaping status: Never Used  Substance Use Topics   Alcohol use: Not Currently   Drug use: Never     Allergies  Allergen Reactions   Calcium-Containing Compounds Nausea Only   Ciprofloxacin Nausea Only   Clams [Shellfish Allergy] Diarrhea and Nausea Only   Contrast Media [Iodinated Contrast Media]    Flagyl [Metronidazole]    Statins    Tape     Adhesive tape-silicones    Current Outpatient Medications  Medication Sig Dispense Refill    Acetaminophen (TYLENOL ARTHRITIS PAIN PO) Take 650 tablets by mouth as needed (arthritis pain).     Evolocumab (REPATHA SURECLICK) 140 MG/ML SOAJ Inject into the skin.     fluticasone (FLONASE) 50 MCG/ACT nasal spray Place into the nose.     hydrochlorothiazide (HYDRODIURIL) 12.5 MG tablet Take 12.5 mg by mouth daily.     hydrocortisone 2.5 % cream Apply 1 Application topically 2 (two) times daily as needed (prn).     ketotifen (ZADITOR) 0.035 % ophthalmic solution Apply to eye.     losartan (COZAAR) 100 MG tablet Take 100 mg by mouth daily.     meloxicam (MOBIC) 15 MG tablet Take by mouth.     pantoprazole (PROTONIX) 40 MG tablet Take 40 mg by mouth daily.     Prenatal MV & Min w/FA-DHA (PRENATAL GUMMIES PO) Take 1 tablet by mouth daily.     No current facility-administered medications for this visit.    OBJECTIVE: Vitals:   04/11/23 1150  BP: (!) 120/55  Pulse: 72  Temp: 98.5 F (36.9 C)  SpO2: 97%     Body mass index is 25.2 kg/m.      General: Well-developed, well-nourished, no acute distress. Eyes: Pink conjunctiva, anicteric sclera. HEENT: Normocephalic, moist mucous membranes, clear oropharnyx. Lungs: Clear to auscultation bilaterally. Heart: Regular rate and rhythm. No rubs, murmurs, or gallops. Abdomen: Soft, nontender, nondistended. No organomegaly noted, normoactive bowel sounds. Musculoskeletal: No edema, cyanosis, or clubbing. Neuro: Alert, answering all questions appropriately. Cranial nerves grossly intact. Skin: No rashes or petechiae noted. Psych: Normal affect. Lymphatics: No cervical, calvicular, axillary or inguinal LAD.   LAB RESULTS:  Lab Results  Component Value Date   NA 141 04/02/2022   K 3.7 04/02/2022   CL 109 04/02/2022   CO2 26 04/02/2022   GLUCOSE 99 04/02/2022   BUN 11 04/02/2022   CREATININE 0.61 04/02/2022   CALCIUM 9.3 04/02/2022   PROT 6.9 03/31/2022   ALBUMIN 4.0 03/31/2022   AST 24 03/31/2022   ALT 17 03/31/2022   ALKPHOS  48 03/31/2022   BILITOT 1.1 03/31/2022   GFRNONAA >60 04/02/2022    Lab Results  Component Value Date   WBC 6.5 03/31/2022   NEUTROABS 3.7 03/31/2022   HGB 12.5 03/31/2022   HCT 37.3 03/31/2022   MCV 95.6 03/31/2022   PLT 248 03/31/2022    No results found for: "TIBC", "FERRITIN", "IRONPCTSAT"   STUDIES: No results found.  ASSESSMENT AND PLAN:   SHAWNTE ROGEL is a 82 y.o. 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  #  IgM MGUS - Patient follows with Dr. Sherryll Burger of neurology with her history of small left thalamus infarct presented as right-sided numbness on 03/31/2022.  She is on 81 mg aspirin.  She has residual right facial and right hand numbness.  - As a part of neuropathy workup, SPEP/IFE done in April 2024 showed faint IgM monoclonal protein with kappa light chain specificity.  Repeat performed on 04/01/2023 showing persistent faint band.  No evidence of anemia, renal dysfunction or hypercalcemia.  -Discussed with patient about the diagnosis.  Will repeat myeloma panel.  She has very faint band which has not been changed in the past 6 months.  Will continue surveillance every 6 months.  I discussed with the patient that her numbness is unlikely to be related to this minute amount of IgM protein.   Orders Placed This Encounter  Procedures   CBC with Differential/Platelet   Comprehensive metabolic panel   Multiple Myeloma Panel (SPEP&IFE w/QIG)   Kappa/lambda light chains   Immunofixation, urine   CBC with Differential (Cancer Center Only)   CMP (Cancer Center only)   Multiple Myeloma Panel (SPEP&IFE w/QIG)   Kappa/lambda light chains   RTC in 6 months for MD visit, labs 2 weeks prior  Patient expressed understanding and was in agreement with this plan. She also understands that She can call clinic at any time with any questions, concerns, or complaints.   I spent a  total of 45 minutes reviewing chart data, face-to-face evaluation with the patient, counseling and coordination of care as detailed above.  Michaelyn Barter, MD   04/11/2023 11:58 AM

## 2023-04-11 NOTE — Progress Notes (Signed)
Patient states that within the last 3 days she has to be careful when getting up because of lightheadedness, more fatigued than usual.

## 2023-04-12 LAB — KAPPA/LAMBDA LIGHT CHAINS
Kappa free light chain: 22.5 mg/L — ABNORMAL HIGH (ref 3.3–19.4)
Kappa, lambda light chain ratio: 1.46 (ref 0.26–1.65)
Lambda free light chains: 15.4 mg/L (ref 5.7–26.3)

## 2023-04-14 LAB — MULTIPLE MYELOMA PANEL, SERUM
Albumin SerPl Elph-Mcnc: 4 g/dL (ref 2.9–4.4)
Albumin/Glob SerPl: 1.3 (ref 0.7–1.7)
Alpha 1: 0.2 g/dL (ref 0.0–0.4)
Alpha2 Glob SerPl Elph-Mcnc: 0.8 g/dL (ref 0.4–1.0)
B-Globulin SerPl Elph-Mcnc: 1.1 g/dL (ref 0.7–1.3)
Gamma Glob SerPl Elph-Mcnc: 1 g/dL (ref 0.4–1.8)
Globulin, Total: 3.1 g/dL (ref 2.2–3.9)
IgA: 218 mg/dL (ref 64–422)
IgG (Immunoglobin G), Serum: 830 mg/dL (ref 586–1602)
IgM (Immunoglobulin M), Srm: 193 mg/dL (ref 26–217)
Total Protein ELP: 7.1 g/dL (ref 6.0–8.5)

## 2023-05-27 NOTE — Progress Notes (Unsigned)
[facility] [address] [place], [place] [address]  Pulmonary Sleep Medicine   Office Visit Note  Patient Name: Shannon [name] DOB: 01/18/1941 MRN [id number]    Chief Complaint: Obstructive Sleep Apnea visit  Brief History:  Shannon [name] is seen today for an annual follow up visit for CPAP@ 12 cmH2O. The patient has a 11 year history of sleep apnea. Patient is using PAP nightly.  The patient feels rested after sleeping with PAP.  The patient reports benefiting from PAP use. Reported sleepiness is  improved and the Epworth Sleepiness Score is *** out of 24. The patient *** take naps. The patient complains of the following: ***  The compliance download shows 95% compliance with an average use time of 6 hours 49 minutes. The AHI is 0.6.  The patient *** of limb movements disrupting sleep. The patient continues to require PAP therapy in order to eliminate sleep apnea.   ROS  General: (-) fever, (-) chills, (-) night sweat Nose and Sinuses: (-) nasal stuffiness or itchiness, (-) postnasal drip, (-) nosebleeds, (-) sinus trouble. Mouth and Throat: (-) sore throat, (-) hoarseness. Neck: (-) swollen glands, (-) enlarged thyroid, (-) neck pain. Respiratory: *** cough, *** shortness of breath, *** wheezing. Neurologic: *** numbness, *** tingling. Psychiatric: *** anxiety, *** depression   Current Medication: Outpatient Encounter Medications as of 05/30/2023  Medication Sig   Acetaminophen (TYLENOL ARTHRITIS PAIN PO) Take 650 tablets by mouth as needed (arthritis pain).   Evolocumab (REPATHA SURECLICK) 140 MG/ML SOAJ Inject into the skin.   fluticasone (FLONASE) 50 MCG/ACT nasal spray Place into the nose.   hydrochlorothiazide (HYDRODIURIL) 12.5 MG tablet Take 12.5 mg by mouth daily.   hydrocortisone 2.5 % cream Apply 1 Application topically 2 (two) times daily as needed (prn).   ketotifen (ZADITOR) 0.035 % ophthalmic solution Apply to eye.   losartan (COZAAR) 100 MG tablet Take  100 mg by mouth daily.   meloxicam (MOBIC) 15 MG tablet Take by mouth.   pantoprazole (PROTONIX) 40 MG tablet Take 40 mg by mouth daily.   Prenatal MV & Min w/FA-DHA (PRENATAL GUMMIES PO) Take 1 tablet by mouth daily.   No facility-administered encounter medications on file as of 05/30/2023.    Surgical History: Past Surgical History:  Procedure Laterality Date   APPENDECTOMY     BLEPHAROPLASTY Bilateral 2016   CATARACT EXTRACTION Bilateral 2012   CHOLECYSTECTOMY     COLONOSCOPY     COLONOSCOPY WITH PROPOFOL N/A 09/17/2020   Procedure: COLONOSCOPY WITH PROPOFOL;  Surgeon: Toledo, Boykin Nearing, MD;  Location: [facility] ENDOSCOPY;  Service: Gastroenterology;  Laterality: N/A;   HERNIA REPAIR     HYSTERECTOMY ABDOMINAL WITH SALPINGO-OOPHORECTOMY  1987   and bladder tuck   KNEE ARTHROSCOPY     LAPAROSCOPIC COLON RESECTION  2014   TOTAL HIP ARTHROPLASTY Left 08/24/2018   TOTAL HIP ARTHROPLASTY Right 01/20/2022   Procedure: TOTAL HIP ARTHROPLASTY ANTERIOR APPROACH;  Surgeon: Lyndle Herrlich, MD;  Location: [facility] ORS;  Service: Orthopedics;  Laterality: Right;   TOTAL KNEE ARTHROPLASTY Right 04/11/2019   vein ligations      Medical History: Past Medical History:  Diagnosis Date   Allergy    Anxiety    Arthritis    osteoarthritis   Cataract    surgery both eyes 04/22/2011 implants   CVA (cerebral vascular accident) (HCC)    Depression    Diverticulitis    Essential hypertension    GERD (gastroesophageal reflux disease)    Heart murmur  History of cataract    Hyperlipidemia    Osteoporosis    Situational mixed anxiety and depressive disorder    Sleep apnea    cpap use    Family History: Non contributory to the present illness  Social History: Social History   Socioeconomic History   Marital status: Divorced    Spouse name: Not on file   Number of children: 3   Years of education: Not on file   Highest education level: Not on file  Occupational History   Not on file   Tobacco Use   Smoking status: Former    Current packs/day: 0.00    Average packs/day: 0.3 packs/day for 15.0 years (3.8 ttl pk-yrs)    Types: Cigarettes    Start date: 08/09/1953    Quit date: 08/09/1968    Years since quitting: 54.8   Smokeless tobacco: Never  Vaping Use   Vaping status: Never Used  Substance and Sexual Activity   Alcohol use: Not Currently   Drug use: Never   Sexual activity: Not Currently  Other Topics Concern   Not on file  Social History Narrative   Lives alone   Social Determinants of Health   Financial Resource Strain: Patient Declined (12/27/2022)   Received from [facility]   Overall Financial Resource Strain (CARDIA)    Difficulty of Paying Living Expenses: Patient declined  Food Insecurity: Patient Declined (12/27/2022)   Received [facility]   Hunger Vital Sign    Worried About Running Out of Food in the Last Year: Patient declined    Ran Out of Food in the Last Year: Patient declined  Transportation Needs: No Transportation Needs (12/27/2022)   Received from [facility]   PRAPARE - Transportation    In the past 12 months, has lack of transportation kept you from medical appointments or from getting medications?: No    Lack of Transportation (Non-Medical): No  Physical Activity: Insufficiently Active (11/19/2019)   Received from [facility], [facility]   Exercise Vital Sign    Days of Exercise per Week: 3 days    Minutes of Exercise per Session: 20 min  Stress: No Stress Concern Present (11/19/2019)   Received from [facility], [facility]   [employer] of Occupational Health - Occupational Stress Questionnaire    Feeling of Stress : Only a little  Social Connections: Socially Isolated (11/19/2019)   Received from [facility], [facility]   Social Connection and Isolation Panel  [NHANES]    Frequency of Communication with Friends and Family: More than three times a week    Frequency of Social Gatherings with Friends and Family: More than three times a week    Attends Religious Services: Never    Database administrator or Organizations: No    Attends Banker Meetings: Never    Marital Status: Divorced  Catering manager Violence: Not At Risk (04/01/2022)   Humiliation, Afraid, Rape, and Kick questionnaire    Fear of Current or Ex-Partner: No    Emotionally Abused: No    Physically Abused: No    Sexually Abused: No    Vital Signs: There were no vitals taken for this visit. There is no height or weight on file to calculate BMI.    Examination: General Appearance: The patient is well-developed, well-nourished, and in no distress. Neck Circumference: *** Skin: Gross inspection of skin unremarkable.  Head: normocephalic, no gross deformities. Eyes: no gross deformities noted. ENT: ears appear grossly normal Neurologic: Alert and oriented. No involuntary movements.  STOP BANG RISK ASSESSMENT S (snore) Have you been told that you snore?     YES/N   T (tired) Are you often tired, fatigued, or sleepy during the day?   YES/NO  O (obstruction) Do you stop breathing, choke, or gasp during sleep? YES/NO   P (pressure) Do you have or are you being treated for high blood pressure? YES/NO   B (BMI) Is your body index greater than 35 kg/m? YES/NO   A (age) Are you 35 years old or older? YES   N (neck) Do you have a neck circumference greater than 16 inches?   YES/NO   G (gender) Are you a female? NO   TOTAL STOP/BANG "YES" ANSWERS        A STOP-Bang score of 2 or less is considered low risk, and a score of 5 or more is high risk for having either moderate or severe OSA. For people who score 3 or 4, doctors may need to perform further assessment to determine how likely they are to have OSA.         EPWORTH SLEEPINESS SCALE:  Scale:  (0)= no chance  of dozing; (1)= slight chance of dozing; (2)= moderate chance of dozing; (3)= high chance of dozing  Chance  Situtation    Sitting and reading: ***    Watching TV: ***    Sitting Inactive in public: ***    As a passenger in car: ***      Lying down to rest: ***    Sitting and talking: ***    Sitting quielty after lunch: ***    In a car, stopped in traffic: ***   TOTAL SCORE:   *** out of 24    SLEEP STUDIES:  Split Study (04/2012 at ) AHI 14.2/hr, REAM AHI 9/hr, min SpO2 89%, CPAP@ 6 cmH2O   CPAP COMPLIANCE DATA:  Date Range: 05/27/2022-05/26/2023  Average Daily Use: 6 hours 49 minutes  Median Use: 6 hours 58 minutes  Compliance for > 4 Hours: 95%  AHI: 0.6 respiratory events per hour  Days Used: 353/365 days  Mask Leak: 20.3  95th Percentile Pressure: 12         LABS: Recent Results (from the past 2160 hour(s))  Immunofixation, urine     Status: None   Collection Time: 04/11/23 12:22 PM  Result Value Ref Range   Immunofixation, Urine Comment     Comment: (NOTE) The immunofixation pattern appears unremarkable. Evidence of monoclonal protein is not apparent. Performed At: [facility] [address] [place], [place] [id number] Jolene Schimke MD WG:[id number]   Kappa/lambda light chains     Status: Abnormal   Collection Time: 04/11/23 12:22 PM  Result Value Ref Range   Kappa free light chain 22.5 (H) 3.3 - 19.4 mg/L   Lambda free light chains 15.4 5.7 - 26.3 mg/L   Kappa, lambda light chain ratio 1.46 0.26 - 1.65    Comment: (NOTE) Performed At: [facility] [address] [place], [place] [id number] Jolene Schimke MD MW:[id number]   Multiple Myeloma Panel (SPEP&IFE w/QIG)     Status: None   Collection Time: 04/11/23 12:22 PM  Result Value Ref Range   IgG (Immunoglobin G), Serum 830 586 - 1,602 mg/dL   IgA 725 64 - 366 mg/dL   IgM (Immunoglobulin M), Srm 193 26 - 217 mg/dL  Total Protein ELP 7.1 6.0 - 8.5 g/dL    Albumin SerPl Elph-Mcnc 4.0 2.9 - 4.4 g/dL   Alpha 1 0.2 0.0 - 0.4 g/dL   Alpha2 Glob SerPl Elph-Mcnc 0.8 0.4 - 1.0 g/dL   B-Globulin SerPl Elph-Mcnc 1.1 0.7 - 1.3 g/dL   Gamma Glob SerPl Elph-Mcnc 1.0 0.4 - 1.8 g/dL   M Protein SerPl Elph-Mcnc Not Observed Not Observed g/dL   Globulin, Total 3.1 2.2 - 3.9 g/dL   Albumin/Glob SerPl 1.3 0.7 - 1.7   IFE 1 Comment     Comment: (NOTE) The immunofixation pattern appears unremarkable. Evidence of monoclonal protein is not apparent.    Please Note Comment     Comment: (NOTE) Protein electrophoresis scan will follow via computer, mail, or courier delivery. Performed At: [facility] [address] [place], [place] [id number] Jolene Schimke MD ON:[id number]   Comprehensive metabolic panel     Status: Abnormal   Collection Time: 04/11/23 12:22 PM  Result Value Ref Range   Sodium 135 135 - 145 mmol/L   Potassium 3.9 3.5 - 5.1 mmol/L   Chloride 99 98 - 111 mmol/L   CO2 26 22 - 32 mmol/L   Glucose, Bld 104 (H) 70 - 99 mg/dL    Comment: Glucose reference range applies only to samples taken after fasting for at least 8 hours.   BUN 18 8 - 23 mg/dL   Creatinine, Ser 4.40 0.44 - 1.00 mg/dL   Calcium 9.5 8.9 - 10.2 mg/dL   Total Protein 7.4 6.5 - 8.1 g/dL   Albumin 4.5 3.5 - 5.0 g/dL   AST 19 15 - 41 U/L   ALT 18 0 - 44 U/L   Alkaline Phosphatase 47 38 - 126 U/L   Total Bilirubin 1.2 0.3 - 1.2 mg/dL   GFR, Estimated >72 >53 mL/min    Comment: (NOTE) Calculated using the CKD-EPI Creatinine Equation (2021)    Anion gap 10 5 - 15    Comment: Performed at [facility], [address] [place], [place] [address]  CBC with Differential/Platelet     Status: None   Collection Time: 04/11/23 12:22 PM  Result Value Ref Range   WBC 7.2 4.0 - 10.5 K/uL   RBC 4.43 3.87 - 5.11 MIL/uL   Hemoglobin 14.7 12.0 - 15.0 g/dL   HCT 34.7 42.5 - 95.6 %   MCV 97.3 80.0 - 100.0 fL   MCH 33.2 26.0 - 34.0 pg   MCHC 34.1 30.0 - 36.0 g/dL   RDW  38.7 56.4 - 33.2 %   Platelets 279 150 - 400 K/uL   nRBC 0.0 0.0 - 0.2 %   Neutrophils Relative % 69 %   Neutro Abs 5.0 1.7 - 7.7 K/uL   Lymphocytes Relative 21 %   Lymphs Abs 1.5 0.7 - 4.0 K/uL   Monocytes Relative 7 %   Monocytes Absolute 0.5 0.1 - 1.0 K/uL   Eosinophils Relative 1 %   Eosinophils Absolute 0.1 0.0 - 0.5 K/uL   Basophils Relative 1 %   Basophils Absolute 0.1 0.0 - 0.1 K/uL   Immature Granulocytes 1 %   Abs Immature Granulocytes 0.04 0.00 - 0.07 K/uL    Comment: Performed at [facility], [address], [place], [place] [address]    Radiology: MM 3D SCREEN BREAST BILATERAL  Result Date: 11/30/2022 CLINICAL DATA:  Screening. EXAM: DIGITAL SCREENING BILATERAL MAMMOGRAM WITH TOMOSYNTHESIS AND CAD TECHNIQUE: Bilateral screening digital craniocaudal and mediolateral oblique mammograms were  obtained. Bilateral screening digital breast tomosynthesis was performed. The images were evaluated with computer-aided detection. COMPARISON:  Previous exam(s). ACR Breast Density Category b: There are scattered areas of fibroglandular density. FINDINGS: There are no findings suspicious for malignancy. IMPRESSION: No mammographic evidence of malignancy. A result letter of this screening mammogram will be mailed directly to the patient. RECOMMENDATION: Screening mammogram in one year. (Code:SM-B-01Y) BI-RADS CATEGORY  1: Negative. Electronically Signed   By: Norva Pavlov M.D.   On: 11/30/2022 16:16    No results found.  No results found.    Assessment and Plan: Patient Active Problem List   Diagnosis Date Noted   MGUS (monoclonal gammopathy of unknown significance) 04/11/2023   CVA (cerebral vascular accident) (HCC) 05/31/2022   Vitamin B12 deficiency 04/01/2022   Right thalamic infarction (HCC) 04/01/2022   Carotid atherosclerosis, bilateral 04/01/2022   Internal carotid artery stenosis, right 04/01/2022   Right sided numbness    History of total hip replacement,  right 01/20/2022   OSA on CPAP 06/02/2020   CPAP use counseling 06/02/2020   Seasonal allergies 06/02/2020   Essential hypertension 12/25/2015      The patient *** tolerate PAP and reports *** benefit from PAP use. The patient was reminded how to *** and advised to ***. The patient was also counselled on ***. The compliance is ***. The AHI is ***.   ***  General Counseling: I have discussed the findings of the evaluation and examination with Shannon [name].  I have also discussed any further diagnostic evaluation thatmay be needed or ordered today. Shannon [name] verbalizes understanding of the findings of todays visit. We also reviewed her medications today and discussed drug interactions and side effects including but not limited excessive drowsiness and altered mental states. We also discussed that there is always a risk not just to her but also people around her. she has been encouraged to call the office with any questions or concerns that should arise related to todays visit.  No orders of the defined types were placed in this encounter.       I have personally obtained a history, examined the patient, evaluated laboratory and imaging results, formulated the assessment and plan and placed orders.  Shannon Pax, MD [facility] Diplomate ABMS Pulmonary Critical Care Medicine and Sleep Medicine

## 2023-05-30 ENCOUNTER — Ambulatory Visit (INDEPENDENT_AMBULATORY_CARE_PROVIDER_SITE_OTHER): Payer: Medicare Other | Admitting: Internal Medicine

## 2023-05-30 VITALS — BP 142/64 | HR 56 | Resp 22 | Ht 64.0 in | Wt 147.2 lb

## 2023-05-30 DIAGNOSIS — Z7189 Other specified counseling: Secondary | ICD-10-CM | POA: Diagnosis not present

## 2023-05-30 DIAGNOSIS — G4733 Obstructive sleep apnea (adult) (pediatric): Secondary | ICD-10-CM | POA: Diagnosis not present

## 2023-05-30 DIAGNOSIS — I10 Essential (primary) hypertension: Secondary | ICD-10-CM | POA: Diagnosis not present

## 2023-05-30 NOTE — Patient Instructions (Signed)

## 2023-07-26 IMAGING — MG MM DIGITAL DIAGNOSTIC UNILAT*L* W/ TOMO W/ CAD
4 series · 4 of 12 positions shown · non-contrast
Comparison: Previous exam(s).

CLINICAL DATA: Patient recalled from screening for possible left
breast distortion.

EXAM:
DIGITAL DIAGNOSTIC UNILATERAL LEFT MAMMOGRAM WITH TOMOSYNTHESIS AND
CAD
TECHNIQUE: Left digital diagnostic mammography and breast tomosynthesis was
performed. The images were evaluated with computer-aided detection.

[L ML synth-2D]
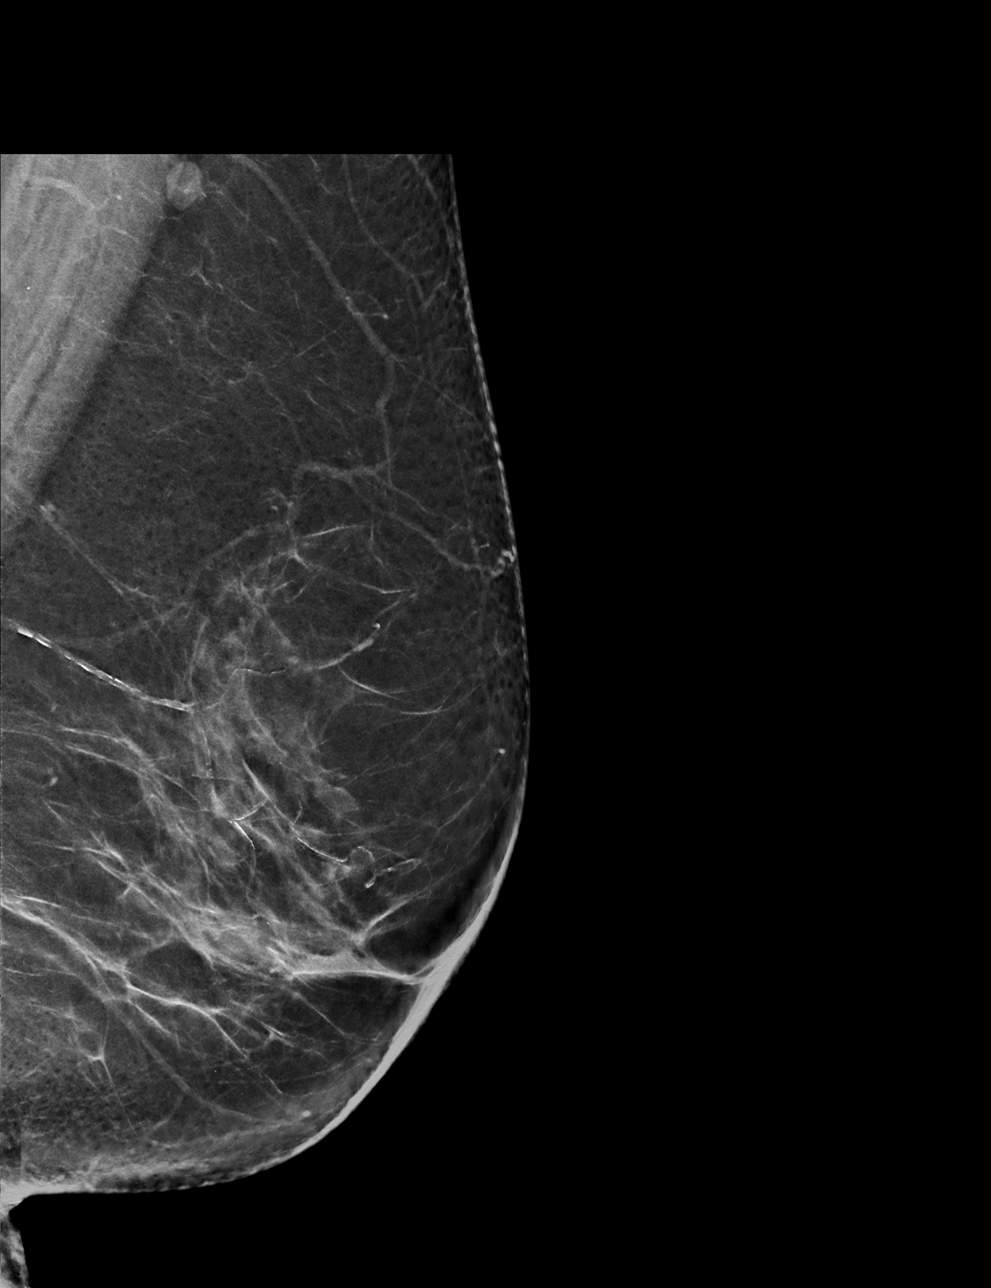

[L MLO synth-2D]
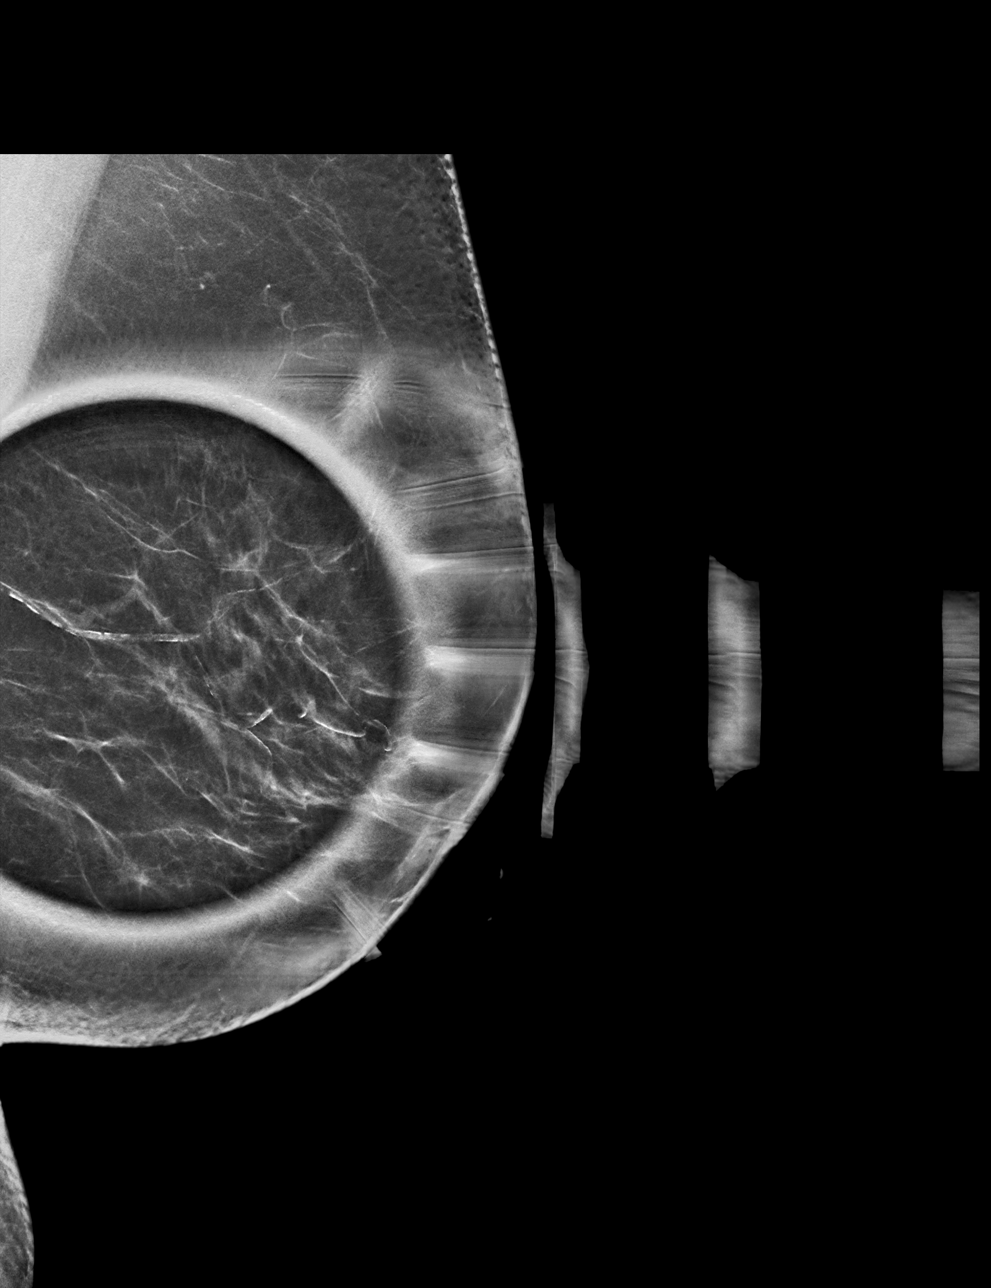

[L MLO tomo · tomo slice 35/68.0]
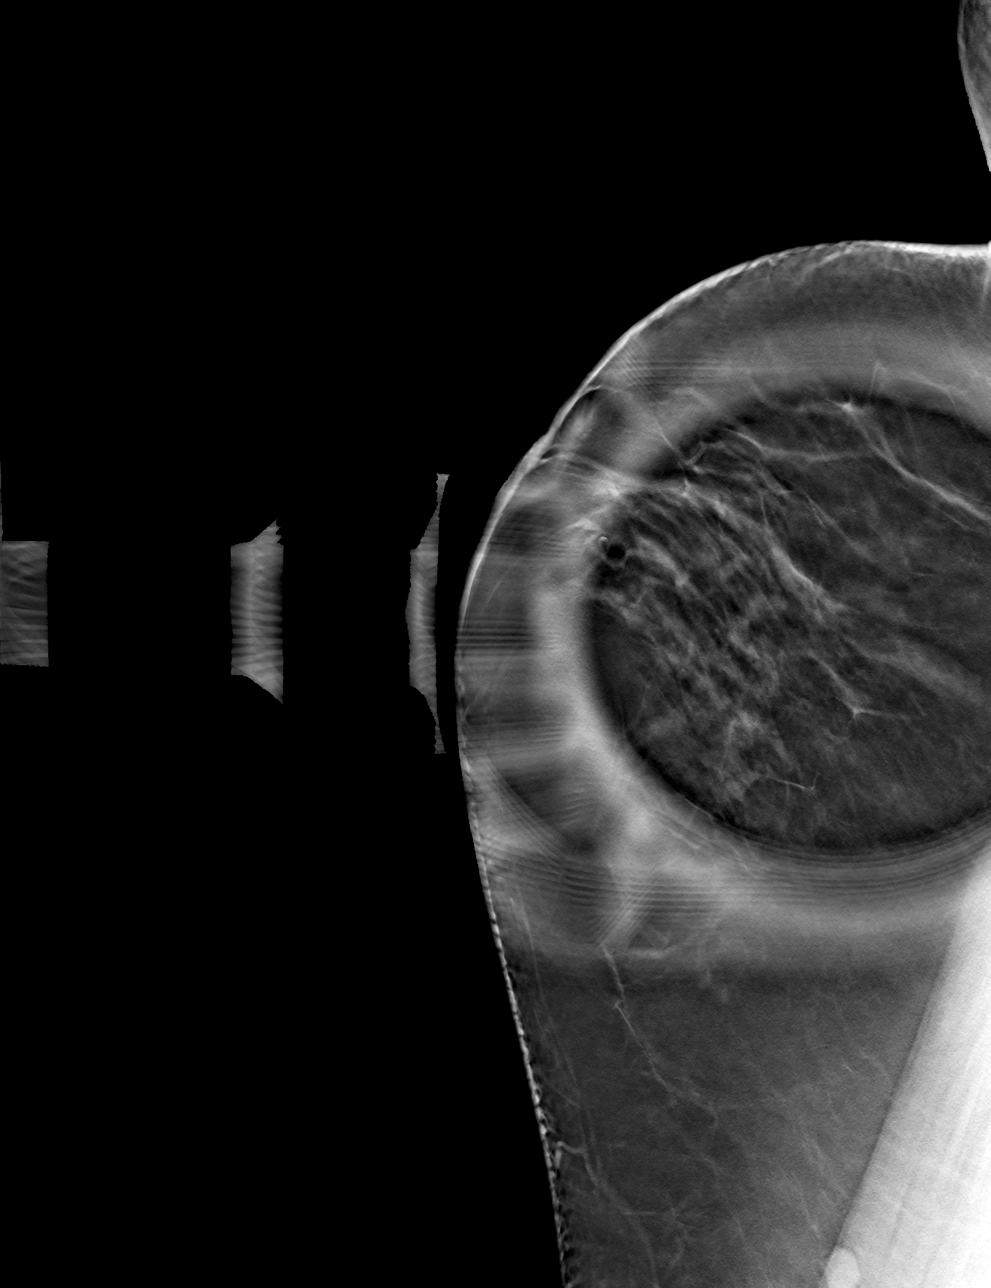

[L ML tomo · tomo slice 39/76.0]
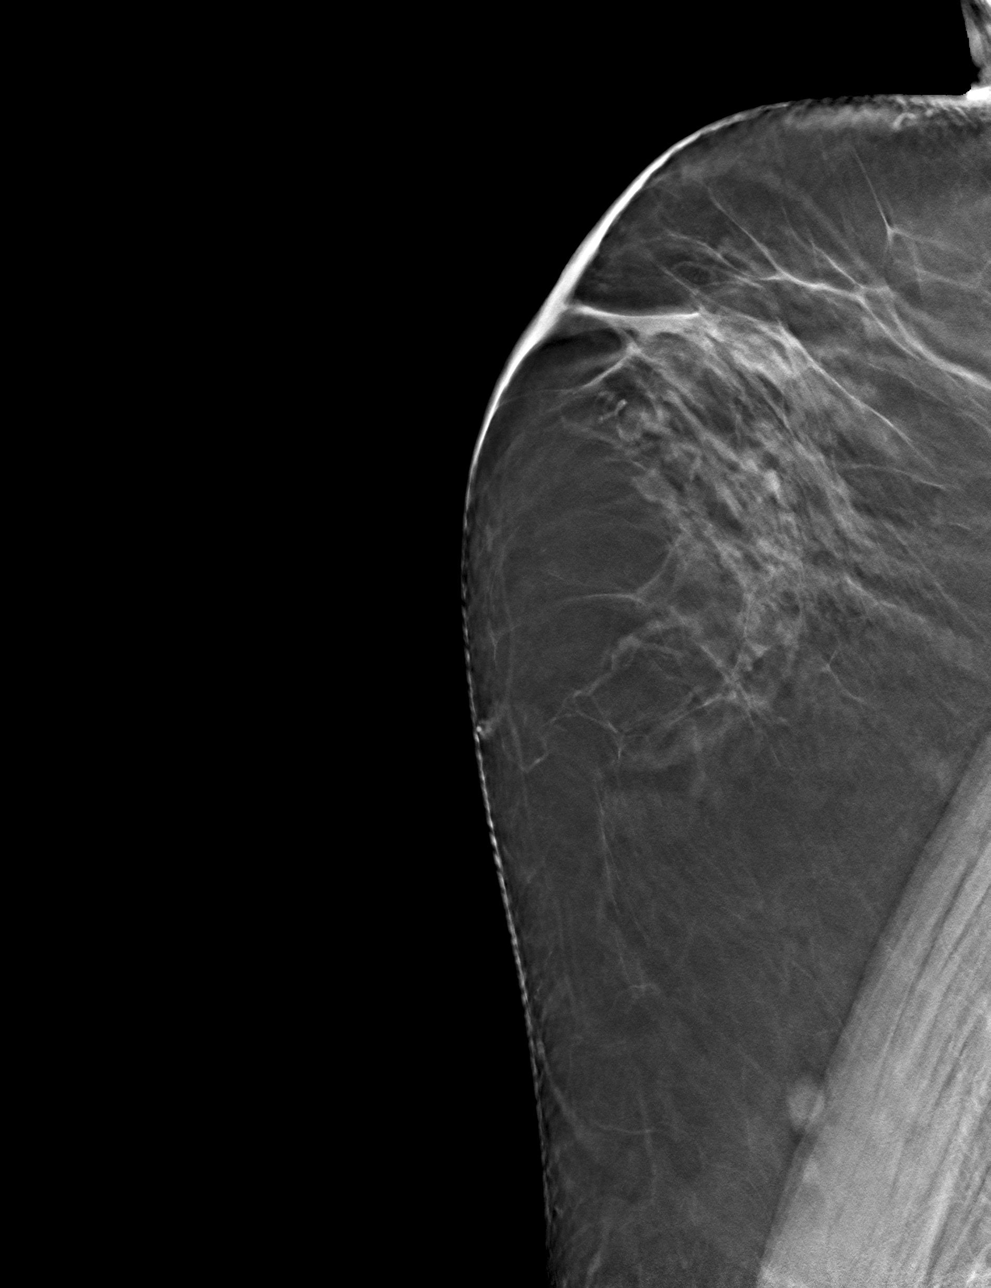

[4 of 12 positions shown; findings below may reference images not displayed]

ACR Breast Density Category c: The breast tissue is heterogeneously
dense, which may obscure small masses.
FINDINGS: Interval resolution of questioned distortion within the left breast
on additional imaging suggestive of dense fibroglandular tissue. No
suspicious findings.
IMPRESSION: No mammographic evidence for malignancy.

RECOMMENDATION:
Screening mammogram in one year.(Code:BR-J-Z1G)

I have discussed the findings and recommendations with the patient.
If applicable, a reminder letter will be sent to the patient
regarding the next appointment.

BI-RADS CATEGORY  2: Benign.

## 2023-09-26 ENCOUNTER — Inpatient Hospital Stay: Payer: Medicare Other | Attending: Internal Medicine

## 2023-09-26 DIAGNOSIS — Z87891 Personal history of nicotine dependence: Secondary | ICD-10-CM | POA: Insufficient documentation

## 2023-09-26 DIAGNOSIS — Z801 Family history of malignant neoplasm of trachea, bronchus and lung: Secondary | ICD-10-CM | POA: Diagnosis not present

## 2023-09-26 DIAGNOSIS — G629 Polyneuropathy, unspecified: Secondary | ICD-10-CM | POA: Diagnosis not present

## 2023-09-26 DIAGNOSIS — D472 Monoclonal gammopathy: Secondary | ICD-10-CM | POA: Diagnosis present

## 2023-09-26 DIAGNOSIS — Z800 Family history of malignant neoplasm of digestive organs: Secondary | ICD-10-CM | POA: Insufficient documentation

## 2023-09-26 DIAGNOSIS — E785 Hyperlipidemia, unspecified: Secondary | ICD-10-CM | POA: Diagnosis not present

## 2023-09-26 LAB — CBC WITH DIFFERENTIAL (CANCER CENTER ONLY)
Abs Immature Granulocytes: 0.03 10*3/uL (ref 0.00–0.07)
Basophils Absolute: 0.1 10*3/uL (ref 0.0–0.1)
Basophils Relative: 1 %
Eosinophils Absolute: 0 10*3/uL (ref 0.0–0.5)
Eosinophils Relative: 1 %
HCT: 40.3 % (ref 36.0–46.0)
Hemoglobin: 13.8 g/dL (ref 12.0–15.0)
Immature Granulocytes: 1 %
Lymphocytes Relative: 22 %
Lymphs Abs: 1.4 10*3/uL (ref 0.7–4.0)
MCH: 32.9 pg (ref 26.0–34.0)
MCHC: 34.2 g/dL (ref 30.0–36.0)
MCV: 96 fL (ref 80.0–100.0)
Monocytes Absolute: 0.5 10*3/uL (ref 0.1–1.0)
Monocytes Relative: 8 %
Neutro Abs: 4.5 10*3/uL (ref 1.7–7.7)
Neutrophils Relative %: 67 %
Platelet Count: 275 10*3/uL (ref 150–400)
RBC: 4.2 MIL/uL (ref 3.87–5.11)
RDW: 12.4 % (ref 11.5–15.5)
WBC Count: 6.6 10*3/uL (ref 4.0–10.5)
nRBC: 0 % (ref 0.0–0.2)

## 2023-09-26 LAB — CMP (CANCER CENTER ONLY)
ALT: 17 U/L (ref 0–44)
AST: 19 U/L (ref 15–41)
Albumin: 4.4 g/dL (ref 3.5–5.0)
Alkaline Phosphatase: 39 U/L (ref 38–126)
Anion gap: 11 (ref 5–15)
BUN: 17 mg/dL (ref 8–23)
CO2: 28 mmol/L (ref 22–32)
Calcium: 9.2 mg/dL (ref 8.9–10.3)
Chloride: 99 mmol/L (ref 98–111)
Creatinine: 0.6 mg/dL (ref 0.44–1.00)
GFR, Estimated: >60 mL/min (ref >60)
Glucose, Bld: 106 mg/dL — ABNORMAL HIGH (ref 70–99)
Potassium: 3.7 mmol/L (ref 3.5–5.1)
Sodium: 138 mmol/L (ref 135–145)
Total Bilirubin: 1.4 mg/dL — ABNORMAL HIGH (ref 0.0–1.2)
Total Protein: 7.2 g/dL (ref 6.5–8.1)

## 2023-09-27 LAB — KAPPA/LAMBDA LIGHT CHAINS
Kappa free light chain: 20.4 mg/L — ABNORMAL HIGH (ref 3.3–19.4)
Kappa, lambda light chain ratio: 1.37 (ref 0.26–1.65)
Lambda free light chains: 14.9 mg/L (ref 5.7–26.3)

## 2023-09-29 LAB — MULTIPLE MYELOMA PANEL, SERUM
Albumin SerPl Elph-Mcnc: 4.3 g/dL (ref 2.9–4.4)
Albumin/Glob SerPl: 1.6 (ref 0.7–1.7)
Alpha 1: 0.2 g/dL (ref 0.0–0.4)
Alpha2 Glob SerPl Elph-Mcnc: 0.7 g/dL (ref 0.4–1.0)
B-Globulin SerPl Elph-Mcnc: 1 g/dL (ref 0.7–1.3)
Gamma Glob SerPl Elph-Mcnc: 0.8 g/dL (ref 0.4–1.8)
Globulin, Total: 2.7 g/dL (ref 2.2–3.9)
IgA: 231 mg/dL (ref 64–422)
IgG (Immunoglobin G), Serum: 825 mg/dL (ref 586–1602)
IgM (Immunoglobulin M), Srm: 180 mg/dL (ref 26–217)
M Protein SerPl Elph-Mcnc: 0.2 g/dL — ABNORMAL HIGH
Total Protein ELP: 7 g/dL (ref 6.0–8.5)

## 2023-10-10 ENCOUNTER — Inpatient Hospital Stay: Payer: Medicare Other | Admitting: Internal Medicine

## 2023-10-11 ENCOUNTER — Inpatient Hospital Stay (HOSPITAL_BASED_OUTPATIENT_CLINIC_OR_DEPARTMENT_OTHER): Admitting: Internal Medicine

## 2023-10-11 ENCOUNTER — Encounter: Payer: Self-pay | Admitting: Internal Medicine

## 2023-10-11 VITALS — BP 145/56 | HR 57 | Temp 97.5°F | Resp 16 | Wt 151.0 lb

## 2023-10-11 DIAGNOSIS — D472 Monoclonal gammopathy: Secondary | ICD-10-CM

## 2023-10-11 NOTE — Progress Notes (Signed)
 Patient denies new or acute problems/concerns today.

## 2023-10-11 NOTE — Progress Notes (Signed)
 [facility]  [phone] [phone]  ID: DENITA LUN OB: Dec 21, 1940  MR#: [id number]  VVO#:[id number]  Patient Care Team: Ethelda Chick, MD as PCP - General (Family Medicine) Michaelyn Barter, MD as Consulting Physician (Oncology)   REASON FOR VISIT: IgM MGUS  HPI: Shannon [name] is a 83 y.o. 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  Patient follows with Dr. Sherryll Burger of neurology with her history of small left thalamus infarct presented as right-sided numbness on 03/31/2022.  She is on 81 mg aspirin.    As a part of neuropathy workup, SPEP/IFE done in April 2024 showed faint IgM monoclonal protein with kappa light chain specificity.  Repeat performed on 04/01/2023 showing persistent faint band.  No evidence of anemia, renal dysfunction or hypercalcemia.  She is on Repatha for hyperlipidemia.  Intolerant to statin.  Interval history Patient was seen today as follow-up for monitoring of IgM MGUS, labs. She is feeling well overall.  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  Denies any new concerns.  REVIEW OF SYSTEMS:   ROS  As per HPI. Otherwise, a complete review of systems is negative.  PAST MEDICAL HISTORY: Past Medical History:  Diagnosis Date   Allergy    Anxiety    Arthritis    osteoarthritis   Cataract    surgery both eyes 04/22/2011 implants   CVA (cerebral vascular accident) (HCC)    Depression    Diverticulitis    Essential hypertension    GERD (gastroesophageal reflux disease)    Heart murmur    History of cataract    Hyperlipidemia    Osteoporosis    Situational mixed anxiety and depressive disorder    Sleep apnea    cpap use    PAST SURGICAL HISTORY: Past Surgical History:  Procedure Laterality Date   APPENDECTOMY      BLEPHAROPLASTY Bilateral 2016   CATARACT EXTRACTION Bilateral 2012   CHOLECYSTECTOMY     COLONOSCOPY     COLONOSCOPY WITH PROPOFOL N/A 09/17/2020   Procedure: COLONOSCOPY WITH PROPOFOL;  Surgeon: Toledo, Boykin Nearing, MD;  Location: [facility] ENDOSCOPY;  Service: Gastroenterology;  Laterality: N/A;   HERNIA REPAIR     HYSTERECTOMY ABDOMINAL WITH SALPINGO-OOPHORECTOMY  1987   and bladder tuck   KNEE ARTHROSCOPY     LAPAROSCOPIC COLON RESECTION  2014   TOTAL HIP ARTHROPLASTY Left 08/24/2018   TOTAL HIP ARTHROPLASTY Right 01/20/2022   Procedure: TOTAL HIP ARTHROPLASTY ANTERIOR APPROACH;  Surgeon: Lyndle Herrlich, MD;  Location: [facility] ORS;  Service: Orthopedics;  Laterality: Right;   TOTAL KNEE ARTHROPLASTY Right 04/11/2019   vein ligations      FAMILY HISTORY: Family History  Problem Relation Age of Onset   Cancer Father    Osteoarthritis Father    Ulcers Father    Cancer Sister        liver, lungs   Arthritis Sister        rheumatoid   Hypertension Sister    Hyperlipidemia Sister    Diabetes Sister    GI Disease Sister    Stroke Sister    Liver disease Sister    Arthritis Sister    Diabetes Brother    Osteoarthritis Brother    Skin cancer Brother    Cancer Brother        pancreatic, kidney   Prostate cancer Brother  Breast cancer Niece        late 22's   Breast cancer Niece     HEALTH MAINTENANCE: Social History   Tobacco Use   Smoking status: Former    Current packs/day: 0.00    Average packs/day: 0.3 packs/day for 15.0 years (3.8 ttl pk-yrs)    Types: Cigarettes    Start date: 08/09/1953    Quit date: 08/09/1968    Years since quitting: 55.2   Smokeless tobacco: Never  Vaping Use   Vaping status: Never Used  Substance Use Topics   Alcohol use: Not Currently   Drug use: Never     Allergies  Allergen Reactions   Calcium-Containing Compounds Nausea Only   Ciprofloxacin Nausea Only   Clams [Shellfish Allergy] Diarrhea and Nausea Only   Contrast Media  [Iodinated Contrast Media]    Flagyl [Metronidazole]    Statins    Tape     Adhesive tape-silicones    Current Outpatient Medications  Medication Sig Dispense Refill   Acetaminophen (TYLENOL ARTHRITIS PAIN PO) Take 650 tablets by mouth as needed (arthritis pain).     amLODipine (NORVASC) 5 MG tablet Take 2.5 mg by mouth daily.     apixaban (ELIQUIS) 5 MG TABS tablet Take 5 mg by mouth 2 (two) times daily.     Evolocumab (REPATHA SURECLICK) 140 MG/ML SOAJ Inject into the skin.     fluticasone (FLONASE) 50 MCG/ACT nasal spray Place into the nose.     hydrocortisone 2.5 % cream Apply 1 Application topically 2 (two) times daily as needed (prn).     losartan (COZAAR) 100 MG tablet Take 100 mg by mouth daily.     magnesium oxide (MAG-OX) 400 MG tablet Take 400 mg by mouth daily.     montelukast (SINGULAIR) 10 MG tablet Take 1 tablet by mouth at bedtime.     hydrochlorothiazide (HYDRODIURIL) 12.5 MG tablet Take 12.5 mg by mouth daily. (Patient not taking: Reported on 10/11/2023)     pantoprazole (PROTONIX) 40 MG tablet Take 40 mg by mouth daily. (Patient not taking: Reported on 10/11/2023)     No current facility-administered medications for this visit.    OBJECTIVE: Vitals:   10/11/23 1100  BP: (!) 145/56  Pulse: (!) 57  Resp: 16  Temp: (!) 97.5 F (36.4 C)     Body mass index is 25.92 kg/m.      General: Well-developed, well-nourished, no acute distress. Eyes: Pink conjunctiva, anicteric sclera. HEENT: Normocephalic, moist mucous membranes, clear oropharnyx. Lungs: Clear to auscultation bilaterally. Heart: Regular rate and rhythm. No rubs, murmurs, or gallops. Abdomen: Soft, nontender, nondistended. No organomegaly noted, normoactive bowel sounds. Musculoskeletal: No edema, cyanosis, or clubbing. Neuro: Alert, answering all questions appropriately. Cranial nerves grossly intact. Skin: No rashes or petechiae noted. Psych: Normal affect. Lymphatics: No cervical, calvicular,  axillary or inguinal LAD.   LAB RESULTS:  Lab Results  Component Value Date   NA 138 09/26/2023   K 3.7 09/26/2023   CL 99 09/26/2023   CO2 28 09/26/2023   GLUCOSE 106 (H) 09/26/2023   BUN 17 09/26/2023   CREATININE 0.60 09/26/2023   CALCIUM 9.2 09/26/2023   PROT 7.2 09/26/2023   ALBUMIN 4.4 09/26/2023   AST 19 09/26/2023   ALT 17 09/26/2023   ALKPHOS 39 09/26/2023   BILITOT 1.4 (H) 09/26/2023   GFRNONAA >60 09/26/2023    Lab Results  Component Value Date   WBC 6.6 09/26/2023   NEUTROABS 4.5 09/26/2023   HGB 13.8 09/26/2023  HCT 40.3 09/26/2023   MCV 96.0 09/26/2023   PLT 275 09/26/2023    No results found for: "TIBC", "FERRITIN", "IRONPCTSAT"   STUDIES: No results found.  ASSESSMENT AND PLAN:   JOURNEI THOMASSEN is a 83 y.o. 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 Sherryll Burger of neurology with history of small left thalamus infarct presented as right-sided numbness on 03/31/2022.  She is on 81 mg aspirin.   - As a part of neuropathy workup, SPEP/IFE done in April 2024 showed faint IgM monoclonal protein with kappa light chain specificity.  Repeat performed on 04/01/2023 showing persistent faint band.  No evidence of anemia, renal dysfunction or hypercalcemia.  -Per Mayo risk calculator, score is 1 which is low intermediate risk.  Absolute risk of progression to lymphoproliferative disorder at 20 years is 21%.  -Labs reviewed.  SPEP/IFE showed minute 0.2 g/dL IgM kappa light chain.  Kappa 20.4, Lambda 14.9 with ratio 1.37 which is normal.  CBC and CMP largely unremarkable.  Her labs is overall stable over the past 1 year.  Will change surveillance plan to annually.  Discussed that neuropathy is unlikely to be related to minute paraprotein.   Orders Placed This Encounter  Procedures   CBC with  Differential (Cancer Center Only)   Kappa/lambda light chains   CMP (Cancer Center only)   Multiple Myeloma Panel (SPEP&IFE w/QIG)   RTC in 1 year for MD visit, labs 2 weeks prior  Patient expressed understanding and was in agreement with this plan. She also understands that She can call clinic at any time with any questions, concerns, or complaints.   I spent a total of 25 minutes reviewing chart data, face-to-face evaluation with the patient, counseling and coordination of care as detailed above.  Michaelyn Barter, MD   10/11/2023 12:57 PM

## 2023-12-13 ENCOUNTER — Other Ambulatory Visit: Payer: Self-pay | Admitting: Family Medicine

## 2023-12-13 ENCOUNTER — Other Ambulatory Visit: Payer: Self-pay | Admitting: Surgery

## 2023-12-13 DIAGNOSIS — Z1231 Encounter for screening mammogram for malignant neoplasm of breast: Secondary | ICD-10-CM

## 2023-12-22 ENCOUNTER — Other Ambulatory Visit: Payer: Self-pay

## 2023-12-22 ENCOUNTER — Encounter
Admission: RE | Admit: 2023-12-22 | Discharge: 2023-12-22 | Disposition: A | Discharge status: Home / Self Care | Source: Ambulatory Visit | Attending: Surgery | Admitting: Surgery

## 2023-12-22 VITALS — Ht 64.0 in | Wt 148.0 lb

## 2023-12-22 DIAGNOSIS — Z01818 Encounter for other preprocedural examination: Secondary | ICD-10-CM

## 2023-12-22 DIAGNOSIS — Z01810 Encounter for preprocedural cardiovascular examination: Secondary | ICD-10-CM

## 2023-12-22 DIAGNOSIS — E119 Type 2 diabetes mellitus without complications: Secondary | ICD-10-CM

## 2023-12-22 DIAGNOSIS — I10 Essential (primary) hypertension: Secondary | ICD-10-CM

## 2023-12-22 DIAGNOSIS — Z8673 Personal history of transient ischemic attack (TIA), and cerebral infarction without residual deficits: Secondary | ICD-10-CM

## 2023-12-22 HISTORY — DX: Other complications of anesthesia, initial encounter: T88.59XA

## 2023-12-22 HISTORY — DX: Ventricular premature depolarization: I49.3

## 2023-12-22 HISTORY — DX: Atrial premature depolarization: I49.1

## 2023-12-22 HISTORY — DX: Type 2 diabetes mellitus without complications: E11.9

## 2023-12-22 HISTORY — DX: Paroxysmal atrial fibrillation: I48.0

## 2023-12-22 HISTORY — DX: Monoclonal gammopathy: D47.2

## 2023-12-22 HISTORY — DX: Occlusion and stenosis of bilateral carotid arteries: I65.23

## 2023-12-22 HISTORY — DX: Long term (current) use of anticoagulants: Z79.01

## 2023-12-22 HISTORY — DX: Obstructive sleep apnea (adult) (pediatric): G47.33

## 2023-12-22 HISTORY — DX: Carpal tunnel syndrome, right upper limb: G56.01

## 2023-12-22 NOTE — Patient Instructions (Addendum)
 Your procedure is scheduled on:12-28-23 Wednesday Report to the Registration Desk on the 1st floor of the [facility].Then proceed to the 2nd floor Surgery Desk To find out your arrival time, please call [phone] between 1PM - 3PM on:12-27-23 Tuesday If your arrival time is 6:00 am, do not arrive before that time as the [facility] entrance doors do not open until 6:00 am.  REMEMBER: Instructions that are not followed completely may result in serious medical risk, up to and including death; or upon the discretion of your surgeon and anesthesiologist your surgery may need to be rescheduled.  Do not eat food after midnight the night before surgery.  No gum chewing or hard candies.  You may however, drink Water  up to 2 hours before you are scheduled to arrive for your surgery. Do not drink anything within 2 hours of your scheduled arrival time.  In addition, your doctor has ordered for you to drink the provided:  Gatorade G2 Drinking this carbohydrate drink up to two hours before surgery helps to reduce insulin resistance and improve patient outcomes. Please complete drinking 2 hours before scheduled arrival time.  One week prior to surgery:Stop NOW (12-22-23) Stop Anti-inflammatories (NSAIDS) such as Advil, Aleve, Ibuprofen, Motrin, Naproxen, Naprosyn and Aspirin  based products such as Excedrin, Goody's Powder, BC Powder. Stop ANY OVER THE COUNTER supplements until after surgery (Calcium , Glucosamine-Chondroitin, Multivitamin, Prebiotic, Probiotic)  You may however, continue to take Tylenol  if needed for pain up until the day of surgery.  Stop apixaban (ELIQUIS) 3 days prior to surgery-Last dose will be on 12-24-23 Saturday  Continue taking all of your other prescription medications up until the day of surgery.  ON THE DAY OF SURGERY ONLY TAKE THESE MEDICATIONS WITH SIPS OF WATER : -amLODipine (NORVASC)   No Alcohol for 24 hours before or after surgery.  No Smoking including  e-cigarettes for 24 hours before surgery.  No chewable tobacco products for at least 6 hours before surgery.  No nicotine patches on the day of surgery.  Do not use any "recreational" drugs for at least a week (preferably 2 weeks) before your surgery.  Please be advised that the combination of cocaine and anesthesia may have negative outcomes, up to and including death. If you test positive for cocaine, your surgery will be cancelled.  On the morning of surgery brush your teeth with toothpaste and water , you may rinse your mouth with mouthwash if you wish. Do not swallow any toothpaste or mouthwash.  Use CHG Soap as directed on instruction sheet.  Do not wear jewelry, make-up, hairpins, clips or nail polish.  For welded (permanent) jewelry: bracelets, anklets, waist bands, etc.  Please have this removed prior to surgery.  If it is not removed, there is a chance that hospital personnel will need to cut it off on the day of surgery.  Do not wear lotions, powders, or perfumes.   Do not shave body hair from the neck down 48 hours before surgery.  Contact lenses, hearing aids and dentures may not be worn into surgery.  Do not bring valuables to the hospital. [facility] is not responsible for any missing/lost belongings or valuables.   Bring your C-PAP to the hospital  Notify your doctor if there is any change in your medical condition (cold, fever, infection).  Wear comfortable clothing (specific to your surgery type) to the hospital.  After surgery, you can help prevent lung complications by doing breathing exercises.  Take deep breaths and cough every 1-2 hours.  Your doctor may order a device called an Incentive Spirometer to help you take deep breaths. When coughing or sneezing, hold a pillow firmly against your incision with both hands. This is called "splinting." Doing this helps protect your incision. It also decreases belly discomfort.  If you are being admitted to the hospital  overnight, leave your suitcase in the car. After surgery it may be brought to your room.  In case of increased patient census, it may be necessary for you, the patient, to continue your postoperative care in the Same Day Surgery department.  If you are being discharged the day of surgery, you will not be allowed to drive home. You will need a responsible individual to drive you home and stay with you for 24 hours after surgery.   If you are taking public transportation, you will need to have a responsible individual with you.  Please call the Pre-admissions Testing Dept. at [phone] if you have any questions about these instructions.  Surgery Visitation Policy:  Patients having surgery or a procedure may have two visitors.  Children under the age of 33 must have an adult with them who is not the patient.     Preparing for Surgery with CHLORHEXIDINE  GLUCONATE (CHG) Soap  Chlorhexidine  Gluconate (CHG) Soap  o An antiseptic cleaner that kills germs and bonds with the skin to continue killing germs even after washing  o Used for showering the night before surgery and morning of surgery  Before surgery, you can play an important role by reducing the number of germs on your skin.  CHG (Chlorhexidine  gluconate) soap is an antiseptic cleanser which kills germs and bonds with the skin to continue killing germs even after washing.  Please do not use if you have an allergy to CHG or antibacterial soaps. If your skin becomes reddened/irritated stop using the CHG.  1. Shower the NIGHT BEFORE SURGERY and the MORNING OF SURGERY with CHG soap.  2. If you choose to wash your hair, wash your hair first as usual with your normal shampoo.  3. After shampooing, rinse your hair and body thoroughly to remove the shampoo.  4. Use CHG as you would any other liquid soap. You can apply CHG directly to the skin and wash gently with a scrungie or a clean washcloth.  5. Apply the CHG soap to your  body only from the neck down. Do not use on open wounds or open sores. Avoid contact with your eyes, ears, mouth, and genitals (private parts). Wash face and genitals (private parts) with your normal soap.  6. Wash thoroughly, paying special attention to the area where your surgery will be performed.  7. Thoroughly rinse your body with warm water .  8. Do not shower/wash with your normal soap after using and rinsing off the CHG soap.  9. Pat yourself dry with a clean towel.  10. Wear clean pajamas to bed the night before surgery.  12. Place clean sheets on your bed the night of your first shower and do not sleep with pets.  13. Shower again with the CHG soap on the day of surgery prior to arriving at the hospital.  14. Do not apply any deodorants/lotions/powders.  15. Please wear clean clothes to the hospital.  How to Use an Incentive Spirometer An incentive spirometer is a tool that measures how well you are filling your lungs with each breath. Learning to take long, deep breaths using this tool can help you keep your lungs clear and  active. This may help to reverse or lessen your chance of developing breathing (pulmonary) problems, especially infection. You may be asked to use a spirometer: After a surgery. If you have a lung problem or a history of smoking. After a long period of time when you have been unable to move or be active. If the spirometer includes an indicator to show the highest number that you have reached, your health care provider or respiratory therapist will help you set a goal. Keep a log of your progress as told by your health care provider. What are the risks? Breathing too quickly may cause dizziness or cause you to pass out. Take your time so you do not get dizzy or light-headed. If you are in pain, you may need to take pain medicine before doing incentive spirometry. It is harder to take a deep breath if you are having pain. How to use your incentive  spirometer  Sit up on the edge of your bed or on a chair. Hold the incentive spirometer so that it is in an upright position. Before you use the spirometer, breathe out normally. Place the mouthpiece in your mouth. Make sure your lips are closed tightly around it. Breathe in slowly and as deeply as you can through your mouth, causing the piston or the ball to rise toward the top of the chamber. Hold your breath for 3-5 seconds, or for as long as possible. If the spirometer includes a coach indicator, use this to guide you in breathing. Slow down your breathing if the indicator goes above the marked areas. Remove the mouthpiece from your mouth and breathe out normally. The piston or ball will return to the bottom of the chamber. Rest for a few seconds, then repeat the steps 10 or more times. Take your time and take a few normal breaths between deep breaths so that you do not get dizzy or light-headed. Do this every 1-2 hours when you are awake. If the spirometer includes a goal marker to show the highest number you have reached (best effort), use this as a goal to work toward during each repetition. After each set of 10 deep breaths, cough a few times. This will help to make sure that your lungs are clear. If you have an incision on your chest or abdomen from surgery, place a pillow or a rolled-up towel firmly against the incision when you cough. This can help to reduce pain while taking deep breaths and coughing. General tips When you are able to get out of bed: Walk around often. Continue to take deep breaths and cough in order to clear your lungs. Keep using the incentive spirometer until your health care provider says it is okay to stop using it. If you have been in the hospital, you may be told to keep using the spirometer at home. Contact a health care provider if: You are having difficulty using the spirometer. You have trouble using the spirometer as often as instructed. Your pain  medicine is not giving enough relief for you to use the spirometer as told. You have a fever. Get help right away if: You develop shortness of breath. You develop a cough with bloody mucus from the lungs. You have fluid or blood coming from an incision site after you cough. Summary An incentive spirometer is a tool that can help you learn to take long, deep breaths to keep your lungs clear and active. You may be asked to use a spirometer after a surgery, if  you have a lung problem or a history of smoking, or if you have been inactive for a long period of time. Use your incentive spirometer as instructed every 1-2 hours while you are awake. If you have an incision on your chest or abdomen, place a pillow or a rolled-up towel firmly against your incision when you cough. This will help to reduce pain. Get help right away if you have shortness of breath, you cough up bloody mucus, or blood comes from your incision when you cough. This information is not intended to replace advice given to you by your health care provider. Make sure you discuss any questions you have with your health care provider. Document Revised: 05/20/2023 Document Reviewed: 05/20/2023 Elsevier Patient Education  2024 [employer].

## 2023-12-23 ENCOUNTER — Encounter
Admission: RE | Admit: 2023-12-23 | Discharge: 2023-12-23 | Disposition: A | Discharge status: Home / Self Care | Source: Ambulatory Visit | Attending: Surgery | Admitting: Surgery

## 2023-12-23 DIAGNOSIS — I10 Essential (primary) hypertension: Secondary | ICD-10-CM | POA: Insufficient documentation

## 2023-12-23 DIAGNOSIS — Z01810 Encounter for preprocedural cardiovascular examination: Secondary | ICD-10-CM | POA: Insufficient documentation

## 2023-12-23 DIAGNOSIS — E119 Type 2 diabetes mellitus without complications: Secondary | ICD-10-CM | POA: Insufficient documentation

## 2023-12-23 DIAGNOSIS — Z8673 Personal history of transient ischemic attack (TIA), and cerebral infarction without residual deficits: Secondary | ICD-10-CM | POA: Insufficient documentation

## 2023-12-27 ENCOUNTER — Encounter: Payer: Self-pay | Admitting: Surgery

## 2023-12-27 MED ORDER — ORAL CARE MOUTH RINSE: 15.0000 mL | Freq: Once | OROMUCOSAL | Status: AC

## 2023-12-27 MED ORDER — SODIUM CHLORIDE 0.9 % IV SOLN: INTRAVENOUS | Status: DC

## 2023-12-27 MED ORDER — CEFAZOLIN SODIUM-DEXTROSE 2-4 GM/100ML-% IV SOLN
2.0000 g | INTRAVENOUS | Status: AC
  Administered 2023-12-28: 2 g via INTRAVENOUS

## 2023-12-27 MED ORDER — CHLORHEXIDINE GLUCONATE 0.12 % MT SOLN
15.0000 mL | Freq: Once | OROMUCOSAL | Status: AC
  Administered 2023-12-28: 15 mL via OROMUCOSAL

## 2023-12-27 NOTE — Progress Notes (Signed)
 Perioperative / Anesthesia Services  Pre-Admission Testing Clinical Review / Pre-Operative Anesthesia Consult  Date: 12/27/23  PATIENT DEMOGRAPHICS: Name: Shannon [name] DOB: [birth year] MRN:   [id number]  Note: Available PAT nursing documentation and vital signs have been reviewed. Clinical nursing staff has updated patient's PMH/PSHx, current medication list, and drug allergies/intolerances to ensure complete and comprehensive history available to assist care teams in MDM as it pertains to the aforementioned surgical procedure and anticipated anesthetic course. Extensive review of available clinical information personally performed. [place] PMH and PSHx updated with any diagnoses/procedures that  may have been inadvertently omitted during his intake with the pre-admission testing department's nursing staff.  PLANNED SURGICAL PROCEDURE(S):    Case: [id number] Date/Time: 12/28/23 0933   Procedure: RELEASE, CARPAL TUNNEL, ENDOSCOPIC (Right: Wrist) - ENDOSCOPIC RIGHT CARPAL TUNNEL RELEASE WITH OPEN SUBCUTANEOUS ANTERIOR TRANSPOSITION OF THE ULNAR NERVE AT THE RIGHT ELBOW.   Anesthesia type: Choice   Diagnosis:      Carpal tunnel syndrome, right [G56.01]     Cubital tunnel syndrome on right [G56.21]   Pre-op diagnosis:      Carpal tunnel syndrome, right     Cubital tunnel syndrome, right   Location: [facility] OR ROOM 02 / [facility]   Surgeons: Elner Hahn, MD     CLINICAL DISCUSSION: Shannon [name] is a 83 y.o. female who is submitted for pre-surgical anesthesia review and clearance prior to her undergoing the above procedure. Patient is a Former Smoker (3.8 pack years; quit 07/1968). Pertinent PMH includes: PAF, diastolic dysfunction, valvular insufficiency, BILATERAL carotid artery disease, LEFT thalamic CVA, chronic cerebral microvascular disease, palpitations, bradycardia, cardiac murmur, HTN, HLD, T2DM, GERD (no daily Tx), OSAH (requires nocturnal PAP therapy), IgM  MGUS, OA, carpal tunnel syndrome, anxiety, depression.  Patient is followed by cardiology Parks Bollman, MD). She was last seen in the cardiology clinic on 11/02/2023; notes reviewed. At the time of her clinic visit, patient doing well overall from a cardiovascular perspective.  Patient reported mild exertional dyspnea at was stable and at baseline.  Patient denied any chest pain, shortness of breath, PND, orthopnea, palpitations, significant peripheral edema, weakness, fatigue, vertiginous symptoms, or presyncope/syncope. Patient with a past medical history significant for cardiovascular diagnoses. Documented physical exam was grossly benign, providing no evidence of acute exacerbation and/or decompensation of the patient's known cardiovascular conditions.  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  Following her stroke, patient with short-term memory changes, however she has no residual physical deficits.  Patient found to have BILATERAL carotid artery disease.  Following her CVA, patient underwent bilateral carotid Doppler study on 04/01/2022 that revealed 50-69% stenosis of the RICA with <50% stenosis noted contralaterally in the LICA.  Vertebral arteries demonstrated antegrade flow.  Normal hemodynamics noted in subclavians.  TTE with bubble study performed on 04/01/2022 revealed a normal left ventricular systolic function with an EF of 55-60%. There were no regional wall motion abnormalities. Left ventricular diastolic Doppler parameters consistent with abnormal relaxation (G1DD). Right ventricular size and function normal. There was moderate mitral and tricuspid valve regurgitation. All transvalvular gradients were noted to be normal providing no evidence suggestive of valvular stenosis. Aorta normal in size with no evidence of ectasia or aneurysmal dilatation.  Long-term cardiac event monitor study performed between 04/08/2022 and 04/15/2022 revealing a predominant underlying sinus rhythm  with a mean heart rate of 61 bpm; range 38-106 bpm.  Infrequent atrial and ventricular ectopy present.  Infrequent atrial and ventricular runs were observed with  the longest lasting 9 beats.  No sustained arrhythmias or prolonged pauses.  Patient ultimately underwent LUX-Dx cardiac monitor implantation on 06/24/2022.  Interrogation of device on 07/28/2023 revealed intermittent episodes of bradycardia that occurred typically during sleep.  There was a single 3.1-second pause occurring in the early morning hours.  There was a single episode of atrial fibrillation lasting 1 hour and 28 minutes, at which time patient was doing housework and felt chest pressure, prompting her to send a transmission.  Patient with an atrial fibrillation diagnosis; CHA2DS2-VASc Score = 8 (age x 2, sex, HTN, CVA x 2, vascular disease, T2DM ). Her rate and rhythm are currently being maintained intrinsically with the need for pharmacological intervention. She is chronically anticoagulated using apixaban; reported to be compliant with therapy with no evidence or reports of GI/GU bleeding.  Blood pressure reasonably controlled at 130/68 mmHg on currently prescribed CCB (amlodipine) and ARB (losartan ) therapies.  Patient is statin intolerant.  She is on PCSK9i (evolocumab) for her HLD diagnosis and further ASCVD prevention. T2DM well controlled with diet lifestyle modification alone.  Most recent hemoglobin A1c was 5.4% when checked on 07/01/2023.  Functional capacity somewhat limited by patient's age and multiple medical comorbidities.  With that said, patient is able to complete all of her ADLs/IADLs without cardiovascular limitation.  Per the DASI, patient able to achieve greater than 4 METS physical activity experiencing significant degree of angina/anginal equivalent symptoms.  No changes were made to her medication regimen.  Patient follow-up with outpatient cardiology in 3 months or sooner if needed.  Shannon [name] is scheduled for  an elective RELEASE, CARPAL TUNNEL, ENDOSCOPIC (Right: Wrist) on 12/28/2023 with Dr. Delayne Feather, MD.  Given patient's past medical history significant for cardiovascular diagnoses, presurgical cardiac clearance was sought by the PAT team. Per cardiology, "this patient is optimized for surgery and may proceed with the planned procedural course with a LOW risk of significant perioperative cardiovascular complications".  Again, this patient is on daily oral anticoagulation therapy using a DOAC medication.  She has been instructed on recommendations for holding her apixaban for 3 days prior to her procedure with plans to restart as soon as postoperative bleeding risk felt to be minimized by his primary attending surgeon. The patient has been instructed that her last dose of should be on 12/24/2023.  Patient reports previous perioperative complications with anesthesia in the past.  Patient reports that she experienced (+) delayed/prolonged emergence from general anesthesia associated with her surgery.  In review her EMR, it is noted that patient underwent a neuraxial anesthetic course here at [facility] ([facility]) in 12/2021 without documented complications.   MOST RECENT VITAL SIGNS:    12/22/2023   11:00 AM 10/11/2023   11:00 AM 05/30/2023    3:31 PM  Vitals with BMI  Height 5\' 4"   5\' 4"   Weight 148 lbs 151 lbs 147 lbs 3 oz  BMI 25.39  25.25  Systolic  145 142  Diastolic  56 64  Pulse  57 56   PROVIDERS/SPECIALISTS: NOTE: Primary physician provider listed below. Patient may have been seen by APP or partner within same practice.   PROVIDER ROLE / SPECIALTY LAST OV  Poggi, Kaylene Pascal, MD Orthopedics (Surgeon) 12/20/2023  Valere Gata, MD Primary Care Provider 07/01/2023  Percival Brace, MD Cardiology 11/02/2023  Agrawal, Kavita, MD  Hematology 10/11/2023  Cam Cava, MD Pulmonary Medicine 05/30/2023   ALLERGIES: Allergies  Allergen Reactions    Calcium -Containing Compounds  Nausea Only   Ciprofloxacin Nausea Only   Clams [Shellfish Allergy] Diarrhea and Nausea Only   Contrast Media [Iodinated Contrast Media]    Flagyl [Metronidazole]    Pentothal [Thiopental] Other (See Comments)    Came out of anesthesia "thrashing" around   Statins    Tape     Adhesive tape-silicones   CURRENT HOME MEDICATIONS: No current facility-administered medications for this encounter.    amLODipine (NORVASC) 5 MG tablet   apixaban (ELIQUIS) 5 MG TABS tablet   CALCIUM  CITRATE PO   Evolocumab (REPATHA SURECLICK) 140 MG/ML SOAJ   fluticasone  (FLONASE ) 50 MCG/ACT nasal spray   Glucosamine-Chondroitin (COSAMIN DS PO)   hydrocortisone  2.5 % cream   ketotifen  (ZADITOR ) 0.035 % ophthalmic solution   losartan  (COZAAR ) 100 MG tablet   montelukast  (SINGULAIR ) 10 MG tablet   Multiple Vitamin (MULTIVITAMIN WITH MINERALS) TABS tablet   PREBIOTIC PRODUCT PO   Probiotic Product (PROBIOTIC PO)   desonide (DESOWEN) 0.05 % cream   triamcinolone cream (KENALOG) 0.1 %   HISTORY: Past Medical History:  Diagnosis Date   Allergy    Arthritis    Bradycardia    Carotid atherosclerosis, bilateral    Carpal tunnel syndrome of right wrist    Cerebral microvascular disease    Complication of anesthesia    a.) delayed emergence from anesthesia following hip replacement   Diastolic dysfunction    Diverticulitis    DM (diabetes mellitus), type 2 (HCC)    Essential hypertension    GERD (gastroesophageal reflux disease)    Heart murmur    History of bilateral cataract extraction 04/22/2011   History of cataract    Hyperlipidemia    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)    On apixaban therapy    OSA on CPAP    Osteoporosis    PAF (paroxysmal atrial fibrillation) (HCC)    a.) CHA2DS2VASc = 8 (age x2, sex, HTN, CVA x 2, vascular disease, T2DM) as of 12/27/2023; b.) underwent LUX-Dx implantable cardiac monitor placement on 06/24/2022; c.)  rate/rhythm maintained intrinsically without pharmacological intervention; chronically anticoagulated with apixaban   Palpitations (both PACs and PVCs)    Situational mixed anxiety and depressive disorder    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   a.) short term memory changes; no residual physical defecits   Valvular insufficiency    Past Surgical History:  Procedure Laterality Date   APPENDECTOMY     BLEPHAROPLASTY Bilateral 2016   CATARACT EXTRACTION Bilateral 2012   CHOLECYSTECTOMY     COLONOSCOPY     COLONOSCOPY WITH PROPOFOL  N/A 09/17/2020   Procedure: COLONOSCOPY WITH PROPOFOL ;  Surgeon: Toledo, Alphonsus Jeans, MD;  Location: [facility] ENDOSCOPY;  Service: Gastroenterology;  Laterality: N/A;   HERNIA REPAIR     HYSTERECTOMY ABDOMINAL WITH SALPINGO-OOPHORECTOMY  1987   and bladder tuck   KNEE ARTHROSCOPY     LAPAROSCOPIC COLON RESECTION  2014   LUX-Dx CARDIAC MONITOR IMPLANTATION  06/24/2022   TOTAL HIP ARTHROPLASTY Left 08/24/2018   TOTAL HIP ARTHROPLASTY Right 01/20/2022   Procedure: TOTAL HIP ARTHROPLASTY ANTERIOR APPROACH;  Surgeon: Jerlyn Moons, MD;  Location: [facility] ORS;  Service: Orthopedics;  Laterality: Right;   TOTAL KNEE ARTHROPLASTY Right 04/11/2019   vein ligations     Family History  Problem Relation Age of Onset   Cancer Father    Osteoarthritis Father    Ulcers Father    Cancer Sister        liver, lungs   Arthritis Sister  rheumatoid   Hypertension Sister    Hyperlipidemia Sister    Diabetes Sister    GI Disease Sister    Stroke Sister    Liver disease Sister    Arthritis Sister    Diabetes Brother    Osteoarthritis Brother    Skin cancer Brother    Cancer Brother        pancreatic, kidney   Prostate cancer Brother    Breast cancer Niece        late 27's   Breast cancer Niece    Social History   Tobacco Use   Smoking status: Former    Current packs/day: 0.00    Average packs/day: 0.3 packs/day for 15.0 years (3.8 ttl pk-yrs)    Types:  Cigarettes    Start date: 08/09/1953    Quit date: 08/09/1968    Years since quitting: 55.4   Smokeless tobacco: Never  Substance Use Topics   Alcohol use: Not Currently   LABS:  Lab Results  Component Value Date   WBC 6.6 09/26/2023   HGB 13.8 09/26/2023   HCT 40.3 09/26/2023   MCV 96.0 09/26/2023   PLT 275 09/26/2023   Lab Results  Component Value Date   NA 138 09/26/2023   CL 99 09/26/2023   K 3.7 09/26/2023   CO2 28 09/26/2023   BUN 17 09/26/2023   CREATININE 0.60 09/26/2023   GFRNONAA >60 09/26/2023   CALCIUM  9.2 09/26/2023   ALBUMIN 4.4 09/26/2023   GLUCOSE 106 (H) 09/26/2023    ECG: Date: 12/23/2023  Time ECG obtained: 1115 AM Rate: 65 bpm Rhythm: Sinus rhythm with occasional PVCs Axis (leads I and aVF): normal Intervals: PR 180 ms. QRS 86 ms. QTc 453 ms. ST segment and T wave changes: Nonspecific ST abnormalities  Evidence of a possible, age undetermined, prior infarct:  No Comparison: Similar to previous tracing obtained on 03/31/2022   IMAGING / PROCEDURES: ECHOCARDIOGRAM COMPLETE BUBBLE STUDY performed on 04/01/2022 Left ventricular ejection fraction, by estimation, is 55 to 60%. The left ventricle has normal function. The left ventricle has no regional wall motion abnormalities. Left ventricular diastolic parameters are consistent with Grade I diastolic dysfunction (impaired relaxation).  Right ventricular systolic function is normal. The right ventricular size is normal.  The mitral valve is normal in structure. Moderate mitral valve regurgitation. No evidence of mitral stenosis.  Tricuspid valve regurgitation is moderate.  The aortic valve is normal in structure. Aortic valve regurgitation is not visualized. No aortic stenosis is present.  The inferior vena cava is normal in size with greater than 50% respiratory variability, suggesting right atrial pressure of 3 mmHg.  Agitated saline contrast bubble study was negative, with no evidence of any  interatrial shunt.   US  CAROTID BILATERAL performed on 090/70/2023 There is focal heterogeneous plaque at the right cervical ICA origin, visually causing approximately 50% origin stenosis. The above flow velocities and ICA/CCA ratio of 2.2 are both consistent with a degree of stenosis of 50-69%. Scattered calcific plaques in the proximal cervical left ICA, but visually are nonstenosing with flow velocities and ICA/CCA ratio compatible with no hemodynamically significant (50% or less) stenosis. Bilateral antegrade vertebral artery flow.   MR BRAIN WO CONTRAST performed on 04/01/2022 Mild chronic small vessel ischemic disease. Small acute infarct in the left thalamus.  No additional acute intracranial process.  IMPRESSION AND PLAN: Shannon [name] has been referred for pre-anesthesia review and clearance prior to her undergoing the planned anesthetic and procedural courses. Available labs, pertinent  testing, and imaging results were personally reviewed by me in preparation for upcoming operative/procedural course. [facility] medical record has been updated following extensive record review and patient interview with PAT staff.   This patient has been appropriately cleared by cardiology with an overall LOW risk of patient experiencing significant perioperative cardiovascular complications. Based on clinical review performed today (12/27/23), barring any significant acute changes in the patient's overall condition, it is anticipated that she will be able to proceed with the planned surgical intervention. Any acute changes in clinical condition may necessitate her procedure being postponed and/or cancelled. Patient will meet with anesthesia team (MD and/or CRNA) on the day of her procedure for preoperative evaluation/assessment. Questions regarding anesthetic course will be fielded at that time.   Pre-surgical instructions were reviewed with the patient during his PAT appointment, and questions were  fielded to satisfaction by PAT clinical staff. She has been instructed on which medications that she will need to hold prior to surgery, as well as the ones that have been deemed safe/appropriate to take on the day of her procedure. As part of the general education provided by PAT, patient made aware both verbally and in writing, that she would need to abstain from the use of any illegal substances during her perioperative course. She was advised that failure to follow the provided instructions could necessitate case cancellation or result in serious perioperative complications up to and including death. Patient encouraged to contact PAT and/or her surgeon's office to discuss any questions or concerns that may arise prior to surgery; verbalized understanding.   Renate Caroline, MSN, APRN, FNP-C, CEN [facility]  Perioperative Services Nurse Practitioner Phone: [phone] Fax: [phone] 12/27/23 2:20 PM  NOTE: This note has been prepared using Dragon dictation software. Despite my best ability to proofread, there is always the potential that unintentional transcriptional errors may still occur from this process.

## 2023-12-28 ENCOUNTER — Ambulatory Visit: Payer: Self-pay | Admitting: Urgent Care

## 2023-12-28 ENCOUNTER — Other Ambulatory Visit: Payer: Self-pay

## 2023-12-28 ENCOUNTER — Ambulatory Visit
Admission: RE | Admit: 2023-12-28 | Discharge: 2023-12-28 | Disposition: A | Discharge status: Home / Self Care | Attending: Surgery | Admitting: Surgery

## 2023-12-28 ENCOUNTER — Encounter: Payer: Self-pay | Admitting: Surgery

## 2023-12-28 ENCOUNTER — Encounter
Admission: RE | Disposition: A | Discharge status: Home / Self Care | Payer: Self-pay | Source: Home / Self Care | Attending: Surgery

## 2023-12-28 DIAGNOSIS — Z87891 Personal history of nicotine dependence: Secondary | ICD-10-CM | POA: Diagnosis not present

## 2023-12-28 DIAGNOSIS — G4733 Obstructive sleep apnea (adult) (pediatric): Secondary | ICD-10-CM | POA: Diagnosis not present

## 2023-12-28 DIAGNOSIS — Z79899 Other long term (current) drug therapy: Secondary | ICD-10-CM | POA: Diagnosis not present

## 2023-12-28 DIAGNOSIS — Z7901 Long term (current) use of anticoagulants: Secondary | ICD-10-CM | POA: Insufficient documentation

## 2023-12-28 DIAGNOSIS — I491 Atrial premature depolarization: Secondary | ICD-10-CM | POA: Insufficient documentation

## 2023-12-28 DIAGNOSIS — K219 Gastro-esophageal reflux disease without esophagitis: Secondary | ICD-10-CM | POA: Diagnosis not present

## 2023-12-28 DIAGNOSIS — Z833 Family history of diabetes mellitus: Secondary | ICD-10-CM | POA: Insufficient documentation

## 2023-12-28 DIAGNOSIS — F32A Depression, unspecified: Secondary | ICD-10-CM | POA: Diagnosis not present

## 2023-12-28 DIAGNOSIS — F419 Anxiety disorder, unspecified: Secondary | ICD-10-CM | POA: Insufficient documentation

## 2023-12-28 DIAGNOSIS — E119 Type 2 diabetes mellitus without complications: Secondary | ICD-10-CM | POA: Insufficient documentation

## 2023-12-28 DIAGNOSIS — I081 Rheumatic disorders of both mitral and tricuspid valves: Secondary | ICD-10-CM | POA: Insufficient documentation

## 2023-12-28 DIAGNOSIS — G5621 Lesion of ulnar nerve, right upper limb: Secondary | ICD-10-CM | POA: Diagnosis present

## 2023-12-28 DIAGNOSIS — G5601 Carpal tunnel syndrome, right upper limb: Secondary | ICD-10-CM | POA: Insufficient documentation

## 2023-12-28 DIAGNOSIS — I493 Ventricular premature depolarization: Secondary | ICD-10-CM | POA: Insufficient documentation

## 2023-12-28 DIAGNOSIS — Z8673 Personal history of transient ischemic attack (TIA), and cerebral infarction without residual deficits: Secondary | ICD-10-CM | POA: Diagnosis not present

## 2023-12-28 DIAGNOSIS — K449 Diaphragmatic hernia without obstruction or gangrene: Secondary | ICD-10-CM | POA: Insufficient documentation

## 2023-12-28 DIAGNOSIS — I10 Essential (primary) hypertension: Secondary | ICD-10-CM | POA: Insufficient documentation

## 2023-12-28 DIAGNOSIS — Z01818 Encounter for other preprocedural examination: Secondary | ICD-10-CM

## 2023-12-28 HISTORY — DX: Palpitations: R00.2

## 2023-12-28 HISTORY — DX: Other cerebrovascular disease: I67.89

## 2023-12-28 HISTORY — PX: CARPAL TUNNEL RELEASE: SHX101

## 2023-12-28 HISTORY — DX: Other ill-defined heart diseases: I51.89

## 2023-12-28 HISTORY — DX: Long term (current) use of anticoagulants: Z79.01

## 2023-12-28 HISTORY — DX: Bradycardia, unspecified: R00.1

## 2023-12-28 HISTORY — DX: Endocarditis, valve unspecified: I38

## 2023-12-28 LAB — GLUCOSE, CAPILLARY: Glucose-Capillary: 125 mg/dL — ABNORMAL HIGH (ref 70–99)

## 2023-12-28 SURGERY — RELEASE, CARPAL TUNNEL, ENDOSCOPIC
Anesthesia: General | Site: Wrist | Laterality: Right

## 2023-12-28 MED ORDER — HYDROCODONE-ACETAMINOPHEN 5-325 MG PO TABS: 1.0000 | ORAL_TABLET | Freq: Four times a day (QID) | ORAL | 0 refills | Status: DC | PRN

## 2023-12-28 MED ORDER — BUPIVACAINE HCL (PF) 0.5 % IJ SOLN
INTRAMUSCULAR | Status: AC
Start: 2023-12-28 — End: ?
  Filled 2023-12-28: qty 30

## 2023-12-28 MED ORDER — FENTANYL CITRATE (PF) 100 MCG/2ML IJ SOLN
INTRAMUSCULAR | Status: AC
  Filled 2023-12-28: qty 2

## 2023-12-28 MED ORDER — 0.9 % SODIUM CHLORIDE (POUR BTL) OPTIME
TOPICAL | Status: DC | PRN
Start: 2023-12-28 — End: 2023-12-28
  Administered 2023-12-28: 500 mL

## 2023-12-28 MED ORDER — CEFAZOLIN SODIUM-DEXTROSE 2-4 GM/100ML-% IV SOLN
INTRAVENOUS | Status: AC
  Filled 2023-12-28: qty 100

## 2023-12-28 MED ORDER — PROPOFOL 10 MG/ML IV BOLUS
INTRAVENOUS | Status: AC
Start: 2023-12-28 — End: ?
  Filled 2023-12-28: qty 20

## 2023-12-28 MED ORDER — PROPOFOL 10 MG/ML IV BOLUS
INTRAVENOUS | Status: DC | PRN
  Administered 2023-12-28: 150 ug/kg/min via INTRAVENOUS
  Administered 2023-12-28: 125 ug/kg/min via INTRAVENOUS

## 2023-12-28 MED ORDER — LIDOCAINE HCL (PF) 2 % IJ SOLN
INTRAMUSCULAR | Status: AC
  Filled 2023-12-28: qty 5

## 2023-12-28 MED ORDER — LIDOCAINE HCL (CARDIAC) PF 100 MG/5ML IV SOSY
PREFILLED_SYRINGE | INTRAVENOUS | Status: DC | PRN
  Administered 2023-12-28: 60 mg via INTRAVENOUS

## 2023-12-28 MED ORDER — HYDROCODONE-ACETAMINOPHEN 5-325 MG PO TABS
2.0000 | ORAL_TABLET | Freq: Once | ORAL | Status: AC
  Administered 2023-12-28: 2 via ORAL

## 2023-12-28 MED ORDER — DEXAMETHASONE SODIUM PHOSPHATE 10 MG/ML IJ SOLN
INTRAMUSCULAR | Status: DC | PRN
  Administered 2023-12-28: 10 mg via INTRAVENOUS

## 2023-12-28 MED ORDER — BUPIVACAINE HCL (PF) 0.5 % IJ SOLN
INTRAMUSCULAR | Status: DC | PRN
Start: 2023-12-28 — End: 2023-12-28
  Administered 2023-12-28: 30 mL

## 2023-12-28 MED ORDER — OXYCODONE HCL 5 MG/5ML PO SOLN: 5.0000 mg | Freq: Once | ORAL | Status: DC | PRN

## 2023-12-28 MED ORDER — PROPOFOL 1000 MG/100ML IV EMUL
INTRAVENOUS | Status: AC
  Filled 2023-12-28: qty 100

## 2023-12-28 MED ORDER — CHLORHEXIDINE GLUCONATE 0.12 % MT SOLN
OROMUCOSAL | Status: AC
  Filled 2023-12-28: qty 15

## 2023-12-28 MED ORDER — KETOROLAC TROMETHAMINE 30 MG/ML IJ SOLN
INTRAMUSCULAR | Status: AC
  Filled 2023-12-28: qty 1

## 2023-12-28 MED ORDER — HYDROCODONE-ACETAMINOPHEN 5-325 MG PO TABS
ORAL_TABLET | ORAL | Status: AC
  Filled 2023-12-28: qty 2

## 2023-12-28 MED ORDER — FENTANYL CITRATE (PF) 100 MCG/2ML IJ SOLN
INTRAMUSCULAR | Status: DC | PRN
  Administered 2023-12-28 (×2): 50 ug via INTRAVENOUS

## 2023-12-28 MED ORDER — DEXAMETHASONE SODIUM PHOSPHATE 10 MG/ML IJ SOLN
INTRAMUSCULAR | Status: AC
  Filled 2023-12-28: qty 1

## 2023-12-28 MED ORDER — FENTANYL CITRATE (PF) 100 MCG/2ML IJ SOLN
25.0000 ug | INTRAMUSCULAR | Status: DC | PRN
  Administered 2023-12-28 (×2): 50 ug via INTRAVENOUS

## 2023-12-28 MED ORDER — OXYCODONE HCL 5 MG PO TABS: 5.0000 mg | ORAL_TABLET | Freq: Once | ORAL | Status: DC | PRN

## 2023-12-28 SURGICAL SUPPLY — 35 items
BENZOIN TINCTURE PRP APPL 2/3 (GAUZE/BANDAGES/DRESSINGS) IMPLANT
BNDG COHESIVE 4X5 TAN STRL LF (GAUZE/BANDAGES/DRESSINGS) ×1 IMPLANT
BNDG ELASTIC 2INX 5YD STR LF (GAUZE/BANDAGES/DRESSINGS) ×1 IMPLANT
BNDG ESMARCH 4X12 STRL LF (GAUZE/BANDAGES/DRESSINGS) ×1 IMPLANT
CHLORAPREP W/TINT 26 (MISCELLANEOUS) ×1 IMPLANT
CORD BIP STRL DISP 12FT (MISCELLANEOUS) ×1 IMPLANT
CUFF TOURN SGL QUICK 18X4 (TOURNIQUET CUFF) ×1 IMPLANT
DRAPE SURG 17X11 SM STRL (DRAPES) ×1 IMPLANT
FORCEPS JEWEL BIP 4-3/4 STR (INSTRUMENTS) ×1 IMPLANT
GAUZE SPONGE 4X4 12PLY STRL (GAUZE/BANDAGES/DRESSINGS) ×1 IMPLANT
GAUZE XEROFORM 1X8 LF (GAUZE/BANDAGES/DRESSINGS) ×1 IMPLANT
GLOVE BIO SURGEON STRL SZ8 (GLOVE) ×1 IMPLANT
GLOVE INDICATOR 8.0 STRL GRN (GLOVE) ×1 IMPLANT
GOWN STRL REUS W/ TWL LRG LVL3 (GOWN DISPOSABLE) ×1 IMPLANT
GOWN STRL REUS W/ TWL XL LVL3 (GOWN DISPOSABLE) ×1 IMPLANT
KIT ESCP INSRT D SLOT CANN KN (MISCELLANEOUS) ×1 IMPLANT
KIT TURNOVER KIT A (KITS) ×1 IMPLANT
LOOP VESSEL MAXI 1X406 RED (MISCELLANEOUS) IMPLANT
MANIFOLD NEPTUNE II (INSTRUMENTS) ×1 IMPLANT
NS IRRIG 500ML POUR BTL (IV SOLUTION) ×1 IMPLANT
PACK EXTREMITY ARMC (MISCELLANEOUS) ×1 IMPLANT
PENCIL SMOKE EVACUATOR (MISCELLANEOUS) ×1 IMPLANT
SPLINT WRIST LG LT TX990309 (SOFTGOODS) IMPLANT
SPLINT WRIST LG RT TX900304 (SOFTGOODS) IMPLANT
SPLINT WRIST M LT TX990308 (SOFTGOODS) IMPLANT
SPLINT WRIST M RT TX990303 (SOFTGOODS) IMPLANT
SPLINT WRIST XL LT TX990310 (SOFTGOODS) IMPLANT
SPLINT WRIST XL RT TX990305 (SOFTGOODS) IMPLANT
STOCKINETTE IMPERVIOUS 9X36 MD (GAUZE/BANDAGES/DRESSINGS) ×1 IMPLANT
STRIP CLOSURE SKIN 1/4X4 (GAUZE/BANDAGES/DRESSINGS) IMPLANT
SUT PROLENE 4 0 PS 2 18 (SUTURE) ×1 IMPLANT
SUT VIC AB 2-0 CT1 TAPERPNT 27 (SUTURE) IMPLANT
SUT VIC AB 3-0 SH 27X BRD (SUTURE) IMPLANT
TRAP FLUID SMOKE EVACUATOR (MISCELLANEOUS) IMPLANT
WATER STERILE IRR 500ML POUR (IV SOLUTION) IMPLANT

## 2023-12-28 NOTE — Anesthesia Procedure Notes (Addendum)
 Procedure Name: LMA Insertion Date/Time: 12/28/2023 9:43 AM  Performed by: Chaise Passarella R, CRNAPre-anesthesia Checklist: Patient identified, Emergency Drugs available, Suction available, Patient being monitored and Timeout performed Patient Re-evaluated:Patient Re-evaluated prior to induction Oxygen Delivery Method: Circle system utilized Preoxygenation: Pre-oxygenation with 100% oxygen Induction Type: IV induction LMA: LMA inserted LMA Size: 3.0 Number of attempts: 1 Placement Confirmation: positive ETCO2 and breath sounds checked- equal and bilateral Tube secured with: Tape Dental Injury: Teeth and Oropharynx as per pre-operative assessment

## 2023-12-28 NOTE — Anesthesia Preprocedure Evaluation (Signed)
 Anesthesia Evaluation  Patient identified by MRN, date of birth, ID band Patient awake    Reviewed: Allergy & Precautions, H&P , NPO status , Patient's Chart, lab work & pertinent test results  Airway Mallampati: II  TM Distance: >3 FB Neck ROM: Full    Dental  (+) Upper Dentures, Lower Dentures   Pulmonary neg pulmonary ROS, sleep apnea and Continuous Positive Airway Pressure Ventilation , former smoker   Pulmonary exam normal        Cardiovascular Exercise Tolerance: Good hypertension, Pt. on medications negative cardio ROS Normal cardiovascular exam+ Valvular Problems/Murmurs      Neuro/Psych  PSYCHIATRIC DISORDERS Anxiety Depression    R lateral calf nueropathy CVA, No Residual Symptoms negative neurological ROS  negative psych ROS   GI/Hepatic negative GI ROS, Neg liver ROS, hiatal hernia, Bowel prep,GERD  Controlled and Medicated,,  Endo/Other  negative endocrine ROSdiabetes    Renal/GU      Musculoskeletal   Abdominal   Peds  Hematology negative hematology ROS (+)   Anesthesia Other Findings Past Medical History: No date: Allergy No date: Arthritis No date: Bradycardia No date: Carotid atherosclerosis, bilateral No date: Carpal tunnel syndrome of right wrist No date: Cerebral microvascular disease No date: Complication of anesthesia     Comment:  a.) delayed emergence from anesthesia following hip               replacement No date: Diastolic dysfunction No date: Diverticulitis No date: DM (diabetes mellitus), type 2 (HCC) No date: Essential hypertension No date: GERD (gastroesophageal reflux disease) No date: Heart murmur 04/22/2011: History of bilateral cataract extraction No date: History of cataract No date: Hyperlipidemia No date: 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) No date: On apixaban therapy No date: OSA on CPAP No date: Osteoporosis No date: PAF (paroxysmal atrial  fibrillation) (HCC)     Comment:  a.) CHA2DS2VASc = 31 (age x2, sex, HTN, CVA x 2, vascular              disease, T2DM) as of 12/27/2023; b.) underwent LUX-Dx               implantable cardiac monitor placement on 06/24/2022; c.)               rate/rhythm maintained intrinsically without               pharmacological intervention; chronically anticoagulated               with apixaban No date: Palpitations (both PACs and PVCs) No date: Situational mixed anxiety and depressive disorder 03/31/2022: Thalamic stroke (LEFT)     Comment:  a.) short term memory changes; no residual physical               defecits No date: Valvular insufficiency  Past Surgical History: No date: APPENDECTOMY 2016: BLEPHAROPLASTY; Bilateral 2012: CATARACT EXTRACTION; Bilateral No date: CHOLECYSTECTOMY No date: COLONOSCOPY 09/17/2020: COLONOSCOPY WITH PROPOFOL ; N/A     Comment:  Procedure: COLONOSCOPY WITH PROPOFOL ;  Surgeon: Toledo,               Alphonsus Jeans, MD;  Location: [facility] ENDOSCOPY;  Service:               Gastroenterology;  Laterality: N/A; No date: HERNIA REPAIR 1987: HYSTERECTOMY ABDOMINAL WITH SALPINGO-OOPHORECTOMY     Comment:  and bladder tuck No date: KNEE ARTHROSCOPY 2014: LAPAROSCOPIC COLON RESECTION 06/24/2022: LUX-Dx CARDIAC MONITOR IMPLANTATION 08/24/2018: TOTAL HIP ARTHROPLASTY; Left 01/20/2022: TOTAL HIP ARTHROPLASTY; Right  Comment:  Procedure: TOTAL HIP ARTHROPLASTY ANTERIOR APPROACH;                Surgeon: Jerlyn Moons, MD;  Location: [facility] ORS;                Service: Orthopedics;  Laterality: Right; 04/11/2019: TOTAL KNEE ARTHROPLASTY; Right No date: vein ligations  BMI    Body Mass Index: 25.40 kg/m      Reproductive/Obstetrics negative OB ROS                             Anesthesia Physical Anesthesia Plan  ASA: 3  Anesthesia Plan: General   Post-op Pain Management:    Induction: Intravenous  PONV Risk Score and Plan: 3 and  Ondansetron  and Dexamethasone   Airway Management Planned: LMA  Additional Equipment:   Intra-op Plan:   Post-operative Plan: Extubation in OR  Informed Consent: I have reviewed the patients History and Physical, chart, labs and discussed the procedure including the risks, benefits and alternatives for the proposed anesthesia with the patient or authorized representative who has indicated his/her understanding and acceptance.     Dental Advisory Given  Plan Discussed with: Anesthesiologist, CRNA and Surgeon  Anesthesia Plan Comments: (Patient consented for risks of anesthesia including but not limited to:  - adverse reactions to medications - damage to eyes, teeth, lips or other oral mucosa - nerve damage due to positioning  - sore throat or hoarseness - Damage to heart, brain, nerves, lungs, other parts of body or loss of life  Patient voiced understanding and assent.)       Anesthesia Quick Evaluation

## 2023-12-28 NOTE — Op Note (Signed)
 12/28/2023  11:20 AM  Patient:   Shannon [name]  Pre-Op Diagnosis:   1. Right carpal tunnel syndrome.  2. Right cubital tunnel syndrome.  Post-Op Diagnosis:   Same  Procedure:   1. Endoscopic right carpal tunnel release.  2. Subcutaneous anterior transposition ulnar nerve, right elbow.  Surgeon:   Lonnie Roberts, MD  Assistant:   [place] Lee Napolitano, PA-S  Anesthesia:   General LMA  Findings:   As above.  Complications:   None  EBL:   1 cc  Fluids:   400 cc crystalloid  TT:   65 minutes at 250 mmHg  Drains:   None  Closure:   Staples  Brief Clinical Note:   The patient is an 83 year old female with a history of progressively worsening pain and paresthesias to her right hand affecting both the medial and ulnar nerve distributions. The symptoms have progressed despite medications, activity modification, etc. The patient's history and examination are consistent with both carpal tunnel and cubital tunnel syndrome. The patient presents at this time for both an endoscopic right carpal tunnel release, as well as for a subcutaneous anterior transposition of the ulnar nerve at the right elbow.  Procedure:   The patient was brought into the operating room and lain in the supine position. After adequate general laryngal mask anesthesia was obtained, the patient's right upper extremity was prepped with ChloraPrep solution before being draped sterilely. Preoperative antibiotics were administered. After performing a timeout to verify the appropriate surgical site, the limb was exsanguinated with an Esmarch and the tourniquet inflated to 250 mmHg.   The carpal tunnel procedure was performed first. An approximately 1.5-2 cm incision was made over the volar wrist flexion crease, centered over the palmaris longus tendon. The incision was carried down through the subcutaneous tissues with care taken to identify and protect any neurovascular structures. The distal forearm fascia was penetrated just  proximal to the transverse carpal ligament. The soft tissues were released off the superficial and deep surfaces of the distal forearm fascia and this was released proximally for 3-4 cm under direct visualization.  Attention was directed distally. The Therapist, nutritional was passed beneath the transverse carpal ligament along the ulnar aspect of the carpal tunnel and used to release any adhesions as well as to remove any adherent synovial tissue before first the smaller then the larger of the two dilators were passed beneath the transverse carpal ligament along the ulnar margin of the carpal tunnel. The slotted cannula was introduced and the endoscope was placed into the slotted cannula and the undersurface of the transverse carpal ligament visualized. The distal margin of the transverse carpal ligament was marked by placing a 25-gauge needle percutaneously at Kaplan's cardinal point so that it entered the distal portion of the slotted cannula. Under endoscopic visualization, the transverse carpal ligament was released from proximal to distal using the end-cutting blade. A second pass was performed to ensure complete release of the ligament. The adequacy of release was verified both endoscopically and by palpation using the freer elevator.  The wound was irrigated thoroughly with sterile saline solution before being closed using 4-0 Prolene interrupted sutures.  Next, the cubital tunnel was addressed. An approximately 7-8 cm curvilinear incision was made along the course of the ulnar nerve posterior to the medial epicondyle. The incision was carried down through the subcutaneous tissues with care taken to avoid the small branches of the medial antebrachial nerve to expose the sheath overlying the cubital tunnel. The ulnar nerve  was identified at the proximal end of the tunnel and was dissected free. The nerve was then carefully followed as the roof of the cubital tunnel was released from proximal to distal.  Distally, the fascia overlying the pronator muscle was released for several centimeters. A vessel loop was passed around the nerve and used to provide gentle traction on the nerve while circumferential dissection was carried out under loupe magnification using bipolar electrocautery and tenotomy scissors.   Once the nerve was fully mobilized, the anterior tissues were elevated as a flap just superficial to the fascia overlying the common flexor origin and a pocket created to accept the nerve. Care was taken to be sure that there was no undue tension along the nerve either proximally or distally. The nerve was carefully retracted while several 2-0 Vicryl interrupted sutures were placed to reapproximate the flap to the medial epicondylar soft tissues, thereby creating a "sling" for the ulnar nerve. The cubital tunnel itself was reapproximated using several 2-0 Vicryl interrupted sutures in order to prevent the nerve from falling back into the cubital tunnel.   The wound was copiously irrigated with sterile saline solution before the subcutaneous tissues were closed using 2-0 Vicryl interrupted sutures. Three-oh Vicryl sutures were used to close the subcuticular layer before benzoin and Steri-Strips were applied to the skin. A total of 30 cc of 0.5% plain Sensorcaine  was injected in and around the two incisions to help with postoperative analgesia. Sterile bulky dressings were applied to the wrist and the elbow before the patient was placed into a Velcro wrist immobilizer. The patient was then awakened, extubated, and returned to the recovery room in satisfactory condition after tolerating the procedure well.

## 2023-12-28 NOTE — Transfer of Care (Signed)
 Immediate Anesthesia Transfer of Care Note  Patient: Shannon [name]  Procedure(s) Performed: RELEASE, CARPAL TUNNEL, ENDOSCOPIC (Right: Wrist)  Patient Location: PACU  Anesthesia Type:General  Level of Consciousness: awake, drowsy, and patient cooperative  Airway & Oxygen Therapy: Patient Spontanous Breathing and Patient connected to face mask oxygen  Post-op Assessment: Report given to RN and Post -op Vital signs reviewed and stable  Post vital signs: Reviewed and stable  Last Vitals:  Vitals Value Taken Time  BP 133/60 12/28/23 1119  Temp    Pulse 50 12/28/23 1121  Resp 13 12/28/23 1121  SpO2 98 % 12/28/23 1121  Vitals shown include unfiled device data.  Last Pain:  Vitals:   12/28/23 0815  TempSrc: Temporal  PainSc: 0-No pain         Complications: There were no known notable events for this encounter.

## 2023-12-28 NOTE — H&P (Signed)
 History of Present Illness: Shannon [name] is a 83 y.o. female who presents today for her surgical history and physical for upcoming right endoscopic carpal tunnel release in addition to open decompression of the right ulnar nerve at the cubital tunnel versus a transposition of the ulnar nerve. Surgery is scheduled with Dr. Daun Epstein on 12/28/2023. The patient denies any trauma or injury affecting the right upper extremity since her last evaluation. She reports a 3 out of 10 pain score in the right upper extremity at today's visit. She denies any catching or locking symptoms in the right upper extremity. The patient does have a history of a stroke in the past, she is followed by [facility] cardiology and does have a history of A-fib. The patient denies any history of asthma or COPD. She denies any history of DVT. She does have a history of diabetes, most recent A1c was 5.4. She does continue to experience a burning and tingling in the right upper extremity at today's visit.  Past Medical History: Allergy (some medications, forms of calcium , forms of iodine . 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  Sleep apnea  Type 2 diabetes mellitus with hyperglycemia, without long-term current use of insulin (CMS/HHS-HCC) 09/20/2022   Past Surgical History: OOPHORECTOMY 1987  BLEPHAROPLASTY 2016  ARTHROPLASTY HIP TOTAL Left 08/24/2018  Procedure: ARTHROPLASTY, ACETABULAR AND PROXIMAL FEMORAL PROSTHETIC REPLACEMENT (TOTAL HIP ARTHROPLASTY), WITH OR WITHOUT AUTOGRAFT OR ALLOGRAFT; Surgeon: Archer Bear, MD; Location:  [facility]; Service: Orthopedics; Laterality: Left;  ARTHROPLASTY TOTAL KNEE Right 04/11/2019  Procedure: ARTHROPLASTY, KNEE, CONDYLE AND PLATEAU; MEDIAL AND LATERAL COMPARTMENTSWITH OR WITHOUT PATELLA RESURFACING (TOTAL KNEE ARTHROPLASTY); Surgeon: Archer Bear, MD; Location: [facility]; Service: Orthopedics; Laterality: Right;  TOTAL HIP ARTHROPLASTY ANTERIOR APPROACH Right 01/20/2022  [place]  APPENDECTOMY don't remember date (done at time of hysterectomy)  CATARACT EXTRACTION  CHOLECYSTECTOMY  COLONOSCOPY  HERNIA REPAIR (done at time of hysterectomy)  HYSTERECTOMY  JOINT REPLACEMENT  KNEE ARTHROSCOPY  LAPAROSCOPIC COLON RESECTION   Past Family History: Pacemaker Mother  Osteoarthritis Father  Osteoporosis (Thinning of bones) Father  Other Father  cancer unknown primary/type  Ulcers Father  High blood pressure (Hypertension) Sister  Hyperlipidemia (Elevated cholesterol) Sister  Reflux disease Sister  Pacemaker Sister  Diabetes type II Sister  Rheum arthritis Sister  Stroke Sister  Liver disease Sister  Reflux disease Sister  Pancreatic cancer Brother  Diabetes type II Brother  Diabetes Brother  Skin cancer Brother  Other Brother  forms of cancer  Cancer Brother  Prostate cancer Brother  Scoliosis Daughter  Anesthesia problems Neg Hx  Malignant hypertension Neg Hx  Malignant hyperthermia Neg Hx  Pseudochol deficiency Neg Hx   Medications: acetaminophen  (TYLENOL  ARTHRITIS ORAL) Take by mouth  amLODIPine (NORVASC) 5 MG tablet Take 1 tablet (5 mg total) by mouth once daily 30 tablet 11  apixaban (ELIQUIS) 5 mg tablet Take 1 tablet (5 mg total) by mouth every 12 (twelve) hours 90 tablet 3  ascorbic acid, vitamin C, (VITAMIN C) 1000 MG tablet Take 1,000 mg by mouth once daily  B.coagul,subtilis/inulin/vit C (CULTURELLE PROBIOTIC-PREBIOTIC ORAL) Take by mouth  calcium  carbonate (CALCIUM  500 ORAL) Take 300 mg by mouth 2 (two) times daily  desonide  (DESOWEN) 0.05 % cream Apply topically 2 (two) times daily  evolocumab (REPATHA SURECLICK) 140 mg/mL PnIj Inject 140 mg subcutaneously every 14 (fourteen) days 2 mL 6  fluticasone  propionate (FLONASE  NASAL) Place into one nostril  glucosamine-msm-hyaluron acid 500-500-1.1 mg Tab Take by mouth 3 caps  hydrocortisone  (ANUSOL -HC) 25 mg suppository Place 1 suppository (25 mg total) rectally 2 (two) times daily as needed for Hemorrhoids 24 suppository 4  hydrocortisone  2.5 % cream Apply topically 2 (two) times daily  ketotifen  (ZADITOR ) 0.025 % (0.035 %) ophthalmic solution Place 1 drop into both eyes 2 (two) times daily  losartan  (COZAAR ) 100 MG tablet TAKE 1 TABLET BY MOUTH ONCE DAILY 90 tablet 4  magnesium  oxide (MAG-OX) 400 mg (241.3 mg magnesium ) tablet Take 220 mg by mouth once daily  montelukast  (SINGULAIR ) 10 mg tablet Take 1 tablet (10 mg total) by mouth at bedtime 90 tablet 4  multivitamin with minerals tablet Take 2 tablets by mouth  pantoprazole  (PROTONIX ) 40 MG DR tablet Take 1 tablet (40 mg total) by mouth once daily 90 tablet 3  triamcinolone 0.1 % cream Apply topically 2 (two) times daily   Allergies: Iodinated Contrast Media Nausea and Other (See Comments)  syncopal episodes  Adhesive Tape-Silicones Unknown (Per pt., "some" paper/cloth tapes ok) Atorvastatin  Muscle Pain  Augmentin [Amoxicillin-Pot Clavulanate] Unknown  Calcium  Unknown ("calium other than Citracal")  Ciprofloxacin Unknown (Pt. states long time ago. Doesn't remember rxn.  Clams Nausea)  Flagyl [Metronidazole Hcl] Unknown (Patient reports nausea, tolerated 09.2020)  Pravastatin Unknown  Shellfish Containing Products Nausea  Statins-Hmg-Coa Reductase Inhibitors Rash and Muscle Pain  Zetia [Ezetimibe] Muscle Pain   Review of Systems:  A comprehensive 14 point ROS was performed, reviewed by me today, and the pertinent orthopaedic findings are documented in the HPI.  Physical Exam: BP 108/64  Ht 162.6 cm (5'  4")  Wt 67.2 kg (148 lb 3.2 oz)  BMI 25.44 kg/m  General/Constitutional: The patient appears to be well-nourished, well-developed, and in no acute distress. Neuro/Psych: Normal mood and affect, oriented to person, place and time. Eyes: Non-icteric. Pupils are equal, round, and reactive to light, and exhibit synchronous movement. ENT: Unremarkable. Lymphatic: No palpable adenopathy. Respiratory: Lungs clear to auscultation, Normal chest excursion, No wheezes, and Non-labored breathing Cardiovascular: Regular rate and rhythm. No murmurs. and No edema, swelling or tenderness, except as noted in detailed exam. Integumentary: No impressive skin lesions present, except as noted in detailed exam. Musculoskeletal: Unremarkable, except as noted in detailed exam.  Right elbow exam: Skin inspection around the right elbow is unremarkable. No swelling, erythema, ecchymosis, abrasions, or other skin abnormalities are identified. There is no elbow effusion. She exhibits full active and passive range of motion of the elbow without any pain or catching. She has a positive Tinel's over the cubital tunnel but the nerve does not sublux with active or passive range of motion of the elbow.  Right wrist/hand exam: Skin inspection around the right wrist and hand again is unremarkable. No swelling, erythema, ecchymosis, abrasions, or other skin abnormalities are identified. There is moderate focal tenderness to palpation over the basilar thumb region in the area of the [facility] joint, but otherwise denies any other areas of tenderness around the wrist or hand. She exhibits near full active and passive range of motion of the wrist without any pain or catching. She can actively flex and extend all digits without any pain or triggering, and can oppose her thumb to the base of the little finger.  She demonstrates a moderately positive grind test to the thumb CMC joint, but has a negative Finklestein's test. She again is  neurovascularly intact to all digits, other than subjectively decreased sensation to light touch to the ring and little finger tips. She has an equivocally positive Phalen's test, but a negative Tinel's over the carpal tunnel.  Imaging: None.  Impression: 1. Carpal tunnel syndrome, right. 2. Cubital tunnel syndrome on right.  Plan:  1. Treatment options were discussed today with the patient. 2. The patient is scheduled for a right endoscopic carpal tunnel release in addition to a anterior transposition of the ulnar nerve at the right elbow versus cubital tunnel decompression scheduled Dr. Daun Epstein on 12/28/23. 3. The patient was instructed on the risk and benefits of surgical intervention and wishes to proceed at this time. 4. This document will serve as a surgical history and physical for the patient. She was instructed to hold her Eliquis for 3 days prior to surgery. 5. The patient will follow-up per standard postop protocol. They can call the clinic they have any questions, new symptoms develop or symptoms worsen.  The procedure was discussed with the patient, as were the potential risks (including bleeding, infection, nerve and/or blood vessel injury, persistent or recurrent pain, failure of the release, progression of arthritis, need for further surgery, blood clots, strokes, heart attacks and/or arhythmias, pneumonia, etc.) and benefits. The patient states her understanding and wishes to proceed.    H&P reviewed and patient re-examined. No changes.

## 2023-12-28 NOTE — Discharge Instructions (Addendum)
 Orthopedic discharge instructions: Keep dressing dry and intact. Keep hand elevated above heart level. May shower after dressing removed on postop day 4 (Sunday). Cover wrist sutures with Band-Aid after drying off, then reapply Velcro wrist splint and Ace wrap to elbow. Apply ice to affected areas frequently. Take ES Tylenol  or pain medication as prescribed when needed.  May resume Eliquis as prescribed tomorrow morning. Return for follow-up in 10-14 days or as scheduled.

## 2023-12-28 NOTE — Anesthesia Postprocedure Evaluation (Signed)
 Anesthesia Post Note  Patient: Shannon [name]  Procedure(s) Performed: RELEASE, CARPAL TUNNEL, ENDOSCOPIC (Right: Wrist)  Patient location during evaluation: PACU Anesthesia Type: General Level of consciousness: awake and alert Pain management: pain level controlled Vital Signs Assessment: post-procedure vital signs reviewed and stable Respiratory status: spontaneous breathing, nonlabored ventilation, respiratory function stable and patient connected to nasal cannula oxygen Cardiovascular status: blood pressure returned to baseline and stable Postop Assessment: no apparent nausea or vomiting Anesthetic complications: no  There were no known notable events for this encounter.   Last Vitals:  Vitals:   12/28/23 1200 12/28/23 1211  BP: (!) 151/64 (!) 161/64  Pulse: (!) 53 (!) 59  Resp: 13 15  Temp:  (!) 36.2 C  SpO2: 90% 96%    Last Pain:  Vitals:   12/28/23 1211  TempSrc: Temporal  PainSc: 4                  Enrique Harvest

## 2023-12-29 ENCOUNTER — Encounter: Payer: Self-pay | Admitting: Surgery

## 2024-01-24 ENCOUNTER — Ambulatory Visit
Admission: RE | Admit: 2024-01-24 | Discharge: 2024-01-24 | Disposition: A | Discharge status: Home / Self Care | Source: Ambulatory Visit | Attending: Family Medicine | Admitting: Family Medicine

## 2024-01-24 DIAGNOSIS — Z1231 Encounter for screening mammogram for malignant neoplasm of breast: Secondary | ICD-10-CM | POA: Diagnosis present

## 2024-06-09 NOTE — Progress Notes (Unsigned)
 [facility] [address] [place], [place] [address]  Pulmonary Sleep Medicine   Office Visit Note  Patient Name: Shannon [name] DOB: 04-03-1941 MRN [id number]    Chief Complaint: Obstructive Sleep Apnea visit  Brief History:  Verneal is seen today for an annual follow up on CPAP @ 12 cmH2O.  The patient has a 12 year history of sleep apnea. Patient is using PAP nightly.  The patient feels rested after sleeping with PAP.  The patient reports benefiting from PAP use. Reported sleepiness is  improved and the Epworth Sleepiness Score is 6 out of 24. The patient does take naps occasionally. The patient complains of the following: pt is in need of replacement supplies. The compliance download shows 98% compliance with an average use time of 6 hours 53 minutes. The AHI is 0.7. The patient does not complain of limb movements disrupting sleep. The patient continues to require PAP therapy in order to eliminate sleep apnea. She reports recently trying to tape her mouth closed to avoid her mouth dropping open during the night.   ROS  General: (-) fever, (-) chills, (-) night sweat Nose and Sinuses: (-) nasal stuffiness or itchiness, (-) postnasal drip, (-) nosebleeds, (-) sinus trouble. Mouth and Throat: (-) sore throat, (-) hoarseness. Neck: (-) swollen glands, (-) enlarged thyroid, (-) neck pain. Respiratory: - cough, - shortness of breath, - wheezing. Neurologic: +numbness, + tingling. Psychiatric: +anxiety, + depression   Current Medication: Outpatient Encounter Medications as of 06/11/2024  Medication Sig   amLODipine (NORVASC) 5 MG tablet Take 2.5 mg by mouth in the morning.   apixaban (ELIQUIS) 5 MG TABS tablet Take 5 mg by mouth 2 (two) times daily.   CALCIUM  CITRATE PO Take 1 tablet by mouth daily before lunch.   desonide (DESOWEN) 0.05 % cream Apply 1 Application topically 2 (two) times daily. To face and neck   Evolocumab (REPATHA SURECLICK) 140 MG/ML SOAJ Inject 140  mg into the skin every 14 (fourteen) days.   fluticasone  (FLONASE ) 50 MCG/ACT nasal spray Place 1 spray into both nostrils daily as needed for allergies.   Glucosamine-Chondroitin (COSAMIN DS PO) Take 1 tablet by mouth 3 (three) times daily.   HYDROcodone -acetaminophen  (NORCO/VICODIN) 5-325 MG tablet Take 1-2 tablets by mouth every 6 (six) hours as needed for moderate pain (pain score 4-6) or severe pain (pain score 7-10).   hydrocortisone  2.5 % cream Apply 1 Application topically 2 (two) times daily as needed (irritation.).   ketotifen  (ZADITOR ) 0.035 % ophthalmic solution Place 1 drop into both eyes 2 (two) times daily as needed (itching/irritated eyes).   losartan  (COZAAR ) 100 MG tablet Take 100 mg by mouth in the morning.   montelukast  (SINGULAIR ) 10 MG tablet Take 10 mg by mouth at bedtime.   Multiple Vitamin (MULTIVITAMIN WITH MINERALS) TABS tablet Take 2 tablets by mouth daily before lunch.   PREBIOTIC PRODUCT PO Take 1 capsule by mouth in the morning.   Probiotic Product (PROBIOTIC PO) Take 1 capsule by mouth in the morning.   triamcinolone cream (KENALOG) 0.1 % Apply 1 Application topically 2 (two) times daily. Apply to body   No facility-administered encounter medications on file as of 06/11/2024.    Surgical History: Past Surgical History:  Procedure Laterality Date   APPENDECTOMY     BLEPHAROPLASTY Bilateral 2016   CARPAL TUNNEL RELEASE Right 12/28/2023   Procedure: RELEASE, CARPAL TUNNEL, ENDOSCOPIC;  Surgeon: Edie Norleen PARAS, MD;  Location: [facility] ORS;  Service: Orthopedics;  Laterality: Right;  ENDOSCOPIC RIGHT CARPAL TUNNEL RELEASE WITH OPEN SUBCUTANEOUS ANTERIOR TRANSPOSITION OF THE ULNAR NERVE AT THE RIGHT ELBOW.   CATARACT EXTRACTION Bilateral 2012   CHOLECYSTECTOMY     COLONOSCOPY     COLONOSCOPY WITH PROPOFOL  N/A 09/17/2020   Procedure: COLONOSCOPY WITH PROPOFOL ;  Surgeon: Toledo, Ladell POUR, MD;  Location: [facility] END[facility];  Service: Gastroenterology;  Laterality: N/A;    HERNIA REPAIR     HYSTERECTOMY ABDOMINAL WITH SALPINGO-OOPHORECTOMY  1987   and bladder tuck   KNEE ARTHROSCOPY     LAPAROSCOPIC COLON RESECTION  2014   LUX-Dx CARDIAC MONITOR IMPLANTATION  06/24/2022   TOTAL HIP ARTHROPLASTY Left 08/24/2018   TOTAL HIP ARTHROPLASTY Right 01/20/2022   Procedure: TOTAL HIP ARTHROPLASTY ANTERIOR APPROACH;  Surgeon: Leora Lynwood SAUNDERS, MD;  Location: [facility] OR[facility];  Service: Orthopedics;  Laterality: Right;   TOTAL KNEE ARTHROPLASTY Right 04/11/2019   vein ligations      Medical History: Past Medical History:  Diagnosis Date   Allergy    Arthritis    Bradycardia    Carotid atherosclerosis, bilateral    Carpal tunnel syndrome of right wrist    Cerebral microvascular disease    Complication of anesthesia    a.) delayed emergence from anesthesia following hip replacement   Diastolic dysfunction    Diverticulitis    DM (diabetes mellitus), type 2 (HCC)    Essential hypertension    GERD (gastroesophageal reflux disease)    Heart murmur    History of bilateral cataract extraction 04/22/2011   History of cataract    Hyperlipidemia    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)    On apixaban therapy    OSA on CPAP    Osteoporosis    PAF (paroxysmal atrial fibrillation) (HCC)    a.) CHA2DS2VASc = 8 (age x2, sex, HTN, CVA x 2, vascular disease, T2DM) as of 12/27/2023; b.) underwent LUX-Dx implantable cardiac monitor placement on 06/24/2022; c.) rate/rhythm maintained intrinsically without pharmacological intervention; chronically anticoagulated with apixaban   Palpitations (both PACs and PVCs)    Situational mixed anxiety and depressive disorder    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   a.) short term memory changes; no residual physical defecits   Valvular insufficiency     Family History: Non contributory to the present illness  Social History: Social History   Socioeconomic History   Marital status: Divorced    Spouse name: Not on file    Number of children: 3   Years of education: Not on file   Highest education level: Not on file  Occupational History   Not on file  Tobacco Use   Smoking status: Former    Current packs/day: 0.00    Average packs/day: 0.3 packs/day for 15.0 years (3.8 ttl pk-yrs)    Types: Cigarettes    Start date: 08/09/1953    Quit date: 08/09/1968    Years since quitting: 55.8   Smokeless tobacco: Never  Vaping Use   Vaping status: Never Used  Substance and Sexual Activity   Alcohol use: Not Currently   Drug use: Never   Sexual activity: Not Currently  Other Topics Concern   Not on file  Social History Narrative   Lives alone   Social Drivers of Health   Financial Resource Strain: Low Risk  (02/03/2024)   Received from [facility]   Overall Financial Resource Strain (CARDIA)    Difficulty of Paying Living Expenses: Not hard at all  Food Insecurity: No Food Insecurity (02/03/2024)   Received  from [facility]   Hunger Vital Sign    Within the past 12 months, you worried that your food would run out before you got the money to buy more.: Never true    Within the past 12 months, the food you bought just didn't last and you didn't have money to get more.: Never true  Transportation Needs: No Transportation Needs (02/03/2024)   Received from [facility] - Transportation    In the past 12 months, has lack of transportation kept you from medical appointments or from getting medications?: No    Lack of Transportation (Non-Medical): No  Physical Activity: Insufficiently Active (11/19/2019)   Received from [facility]   Exercise Vital Sign    On average, how many days per week do you engage in moderate to strenuous exercise (like a brisk walk)?: 3 days    On average, how many minutes do you engage in exercise at this level?: 20 min  Stress: No Stress Concern Present (11/19/2019)   Received from [facility] Occup[facility] - Occupational Stress Questionnaire    Feeling of Stress : Only a little  Social Connections: Socially Isolated (11/19/2019)   Received from [facility]   Social Connection and Isolation Panel    In a typical week, how many times do you talk on the phone with family, friends, or neighbors?: More than three times a week    How often do you get together with friends or relatives?: More than three times a week    How often do you attend church or religious services?: Never    Do you belong to any clubs or organizations such as church groups, unions, fraternal or athletic groups, or school groups?: No    How often do you attend meetings of the clubs or organizations you belong to?: Never    Are you married, widowed, divorced, separated, never married, or living with a partner?: Divorced  Intimate Partner Violence: Not At Risk (04/01/2022)   Humiliation, Afraid, Rape, and Kick questionnaire    Fear of Current or Ex-Partner: No    Emotionally Abused: No    Physically Abused: No    Sexually Abused: No    Vital Signs: There were no vitals taken for this visit. There is no height or weight on file to calculate BMI.    Examination: General Appearance: The patient is well-developed, well-nourished, and in no distress. Neck Circumference: 34 cm Skin: Gross inspection of skin unremarkable. Head: normocephalic, no gross deformities. Eyes: no gross deformities noted. ENT: ears appear grossly normal Neurologic: Alert and oriented. No involuntary movements.  STOP BANG RISK ASSESSMENT S (snore) Have you been told that you snore?     NO   T (tired) Are you often tired, fatigued, or sleepy during the day?   NO  O (obstruction) Do you stop breathing, choke, or gasp during sleep? NO   P (pressure) Do you have or are you being treated for high blood pressure? YES   B (BMI) Is your body index greater than 35 kg/m? NO   A (age) Are  you 109 years old or older? YES   N (neck) Do you have a neck circumference greater than 16 inches?   NO   G (gender) Are you a female? NO   TOTAL STOP/BANG "YES" ANSWERS 2       A STOP-Bang score of 2 or less  is considered low risk, and a score of 5 or more is high risk for having either moderate or severe OSA. For people who score 3 or 4, doctors may need to perform further assessment to determine how likely they are to have OSA.         EPWORTH SLEEPINESS SCALE:  Scale:  (0)= no chance of dozing; (1)= slight chance of dozing; (2)= moderate chance of dozing; (3)= high chance of dozing  Chance  Situtation    Sitting and reading: 2    Watching TV: 1    Sitting Inactive in public: 0    As a passenger in car: 0      Lying down to rest: 3    Sitting and talking: 0    Sitting quielty after lunch: 0    In a car, stopped in traffic: 0   TOTAL SCORE:   6 out of 24    SLEEP STUDIES:  Split Study (04/2012) AHI 14.2/hr, REM AHI 9/hr, min Sp02 89%, CPAP @ 9 cmH2O   CPAP COMPLIANCE DATA:  Date Range: 06/07/2024 - 06/06/2024  Average Daily Use: 6 hours 54 minutes  Median Use: 6 hours 57 minutes  Compliance for > 4 Hours: 98% days  AHI: 0.7 respiratory events per hour  Days Used: 364/365  Mask Leak: 27.6  95th Percentile Pressure: 12 cmh2O         LABS: No results found for this or any previous visit (from the past 2160 hours).  Radiology: MM 3D SCREENING MAMMOGRAM BILATERAL BREAST Result Date: 01/26/2024 CLINICAL DATA:  Screening. EXAM: DIGITAL SCREENING BILATERAL MAMMOGRAM WITH TOMOSYNTHESIS AND CAD TECHNIQUE: Bilateral screening digital craniocaudal and mediolateral oblique mammograms were obtained. Bilateral screening digital breast tomosynthesis was performed. The images were evaluated with computer-aided detection. COMPARISON:  Previous exam(s). ACR Breast Density Category b: There are scattered areas of fibroglandular density. FINDINGS: There are no  findings suspicious for malignancy. IMPRESSION: No mammographic evidence of malignancy. A result letter of this screening mammogram will be mailed directly to the patient. RECOMMENDATION: Screening mammogram in one year. (Code:SM-B-01Y) BI-RADS CATEGORY  1: Negative. Electronically Signed   By: Alm Parkins M.D.   On: 01/26/2024 10:26    No results found.  No results found.    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 OSA on CPAP (Primary) The patient does tolerate PAP and reports  benefit from PAP use. The patient was reminded how to clean equipment and advised to replace supplies routinely. . The compliance is excellent. The AHI is 0.7.   OSA on cpap- controlled. Continue with excellent compliance with pap. CPAP continues to be medically necessary to treat this patient's OSA. F/u one year.    2. CPAP use counseling CPAP Counseling: had a lengthy discussion with the patient regarding the importance of PAP therapy in management of the sleep apnea. Patient appears to understand the  risk factor reduction and also understands the risks associated with untreated sleep apnea. Patient will try to make a good faith effort to remain compliant with therapy. Also instructed the patient on proper cleaning of the device including the water  must be  changed daily if possible and use of distilled water  is preferred. Patient understands that the machine should be regularly cleaned with appropriate recommended cleaning solutions that do not damage the PAP machine for example given white vinegar and water  rinses. Other methods such as ozone treatment may not be as good as these simple methods to achieve cleaning.   3. Essential hypertension Controlled with losartan , continue.      General Counseling: I have discussed the findings of the evaluation and examination with Rock.  I have also discussed any further diagnostic evaluation thatmay be needed or ordered today. Almira verbalizes understanding of the findings of todays visit. We also reviewed her medications today and discussed drug interactions and side effects including but not limited excessive drowsiness and altered mental states. We also discussed that there is always a risk not just to her but also people around her. she has been encouraged to call the office with any questions or concerns that should arise related to todays visit.  No orders of the defined types were placed in this encounter.       I have personally obtained a history, examined the patient, evaluated laboratory and imaging results, formulated the assessment and plan and placed orders. This patient was seen today by Lauraine Lay, PA-C in collaboration with Dr. Elfreda Bathe.   Elfreda DELENA Bathe, MD [facility] Diplomate ABMS Pulmonary Critical Care Medicine and Sleep Medicine

## 2024-06-11 ENCOUNTER — Ambulatory Visit: Admitting: Internal Medicine

## 2024-06-11 VITALS — BP 139/76 | HR 63 | Resp 16 | Ht 64.0 in | Wt 143.0 lb

## 2024-06-11 DIAGNOSIS — Z7189 Other specified counseling: Secondary | ICD-10-CM

## 2024-06-11 DIAGNOSIS — G4733 Obstructive sleep apnea (adult) (pediatric): Secondary | ICD-10-CM | POA: Diagnosis not present

## 2024-06-11 DIAGNOSIS — I10 Essential (primary) hypertension: Secondary | ICD-10-CM

## 2024-06-11 NOTE — Patient Instructions (Signed)

## 2024-09-25 ENCOUNTER — Other Ambulatory Visit

## 2024-10-09 ENCOUNTER — Ambulatory Visit: Admitting: Oncology
